# Patient Record
Sex: Female | Born: 1970 | Race: White | Hispanic: No | Marital: Married | Smoking: Never smoker
Health system: Southern US, Community
[De-identification: ages and names within clinical notes are randomized; demographics above are authoritative.]

## PROBLEM LIST (undated history)

## (undated) DIAGNOSIS — N6002 Solitary cyst of left breast: Secondary | ICD-10-CM

## (undated) HISTORY — PX: TUBAL LIGATION: SHX77

---

## 2010-12-07 ENCOUNTER — Observation Stay
Admission: EM | Admit: 2010-12-07 | Disposition: A | Discharge status: Home / Self Care | Payer: Self-pay | Source: Emergency Department | Admitting: Emergency Medicine

## 2010-12-07 LAB — CBC AND DIFFERENTIAL
BASOPHILS %: 0.1 % (ref 0.0–2.0)
Baso(Absolute): 0.01 10*3/uL (ref 0.00–0.20)
Eosinophils %: 0 % (ref 0.0–6.0)
Eosinophils Absolute: 0 10*3/uL — ABNORMAL LOW (ref 0.10–0.30)
Hematocrit: 39.8 % (ref 27.0–49.5)
Hemoglobin: 14.3 g/dL (ref 11.7–15.5)
Immature Granulocytes #: 0.01 10*3/uL (ref 0.00–0.05)
Immature Granulocytes %: 0.1 % — ABNORMAL HIGH (ref 0.0–0.0)
Lymphocytes Absolute: 0.74 10*3/uL — ABNORMAL LOW (ref 1.00–4.80)
Lymphocytes Relative: 6.1 % — ABNORMAL LOW (ref 25.0–55.0)
MCH: 30.6 pg (ref 27.0–34.0)
MCHC: 35.9 g/dL (ref 32.0–36.0)
MCV: 85.2 fL (ref 80–100)
MPV: 9 fL (ref 9.0–13.0)
Monocytes Absolute: 0.2 10*3/uL (ref 0.10–1.20)
Monocytes Relative %: 1.6 % (ref 1.0–8.0)
Neutrophils Absolute: 11.18 10*3/uL — ABNORMAL HIGH (ref 1.80–7.70)
Neutrophils Relative %: 92.2 % — ABNORMAL HIGH (ref 49.0–69.0)
Nucleated RBC %: 0 /100WBC (ref 0.0–0.0)
Nucleted RBC #: 0 10*3/uL (ref 0.00–0.00)
Platelets: 240 10*3/uL (ref 150–400)
RBC: 4.67 M/uL (ref 3.80–5.40)
RDW: 12.9 % (ref 11.0–14.0)
WBC: 12.13 10*3/uL — ABNORMAL HIGH (ref 4.80–10.80)

## 2010-12-07 LAB — COMPREHENSIVE METABOLIC PANEL
ALT: 26 U/L (ref 7–56)
AST (SGOT): 26 U/L (ref 5–40)
Albumin, Synovial: 4.5 g/dL (ref 3.9–5.0)
Alkaline Phosphatase: 77 U/L (ref 38–126)
BUN / Creatinine Ratio: 31 — ABNORMAL HIGH (ref 8–20)
BUN: 19 mg/dL (ref 6–20)
Bilirubin, Total: 0.6 mg/dL (ref 0.2–1.3)
CO2: 21 mmol/L (ref 21.0–31.0)
Calcium: 9.2 mg/dL (ref 8.4–10.2)
Chloride: 103 mmol/L (ref 101–111)
Creatinine: 0.62 mg/dL (ref 0.52–1.04)
EGFR: >60 mL/min/{1.73_m2}
EGFR: >60 mL/min/{1.73_m2}
Glucose: 137 mg/dL — ABNORMAL HIGH (ref 70–100)
Potassium: 4 mmol/L (ref 3.6–5.0)
Protein, Total: 7.9 g/dL (ref 6.3–8.2)
Sodium: 137 mmol/L (ref 135–145)

## 2010-12-07 LAB — CK TOTAL AND CKMB: CKMB Mass: 2.49 ng/mL (ref 0.00–3.38)

## 2010-12-07 LAB — TROPONIN I
Troponin I: <0.012 ng/mL (ref 0.000–0.034)
Troponin I: <0.012 ng/mL (ref 0.000–0.034)

## 2010-12-07 LAB — LI-FRAUMENI SYNDROME KNOWN FAMILIAL MUTATION: Lipase: 63 U/L (ref 23–300)

## 2010-12-07 LAB — AMYLASE: Amylase: 54 U/L (ref 29–110)

## 2010-12-07 LAB — CK
Creatine Kinase (CK): 422 U/L — ABNORMAL HIGH (ref 19–204)
Creatine Kinase (CK): 342 U/L — ABNORMAL HIGH (ref 19–204)

## 2010-12-07 LAB — HCG, SERUM, QUALITATIVE: Hcg Qualitative: NEGATIVE

## 2010-12-07 LAB — MAGNESIUM: Magnesium: 1.8 mg/dL (ref 1.7–2.2)

## 2010-12-07 LAB — MYOGLOBIN, SERUM: Myoglobin: 79.5 ng/mL — ABNORMAL HIGH (ref 0.0–62.0)

## 2010-12-13 DIAGNOSIS — R079 Chest pain, unspecified: Secondary | ICD-10-CM | POA: Insufficient documentation

## 2011-06-21 NOTE — Consults (Signed)
Maria Rowe, Maria Rowe                                                    MRN:          [id number]                                                          Account:      [id number]                                                     Document ID:  [id number]                                               Service Date: 12/07/2010                                                                                    MRN: [id number]  Document ID: [id number]  Admit Date: 12/07/2010     Patient Location: [id number]   Patient Type: O     CONSULTING PHYSICIAN: Joni Reining MD     REFERRING PHYSICIAN:         HISTORY OF PRESENT ILLNESS:  the patient is a 40 year old female with no prior cardiac history.  She  began using P90X program.  She started this on March 19.  She had no  problems during the aerobic part of exercise.  Last evening, she developed  the onset of right-sided chest heaviness with an occasional sharp  component.  This persisted throughout the night. The discomfort was  definitely made worse in the supine position or if she turned on her right  side.  It persisted and she came to the emergency department.  She has a  brother who had MI at age 43 and the patient was quite concerned.     Generally, the patient as well.  She has no PND, orthopnea, pedal edema,  palpitations, syncope or presyncope.     PAST MEDICAL HISTORY:  C-section x2.     RISK FACTORS:  Positive family history as noted above.     SOCIAL HISTORY:  Occasional alcohol, no tobacco.     REVIEW OF SYSTEMS:  See history of present illness.  Other systems including constitutional,  head, eyes, ears, nose and throat, GI, GU, musculoskeletal, skin,  respiratory, neurologic, psychiatric, endocrinologic systems negative.     PHYSICAL EXAMINATION:  VITAL SIGNS:  Blood pressure 136/71, pulse 70 and regular, respirations 16  and unlabored.  GENERAL:  Well-developed, well-nourished, alert and oriented  x3.  HEENT:  No jugular venous distention or carotid bruits.   Conjunctivae pink,  oral mucosa moist.                                                                                                           Page 1 of 2  Maria Rowe, Maria Rowe                                                    MRN:          [id number]                                                          Account:      [id number]                                                     Document ID:  [id number]                                               Service Date: 12/07/2010                                                                                    LUNGS:  Clear.  CARDIAC:  PMI normal. No significant murmurs, rubs, clicks, or gallops.  ABDOMEN:  Mild midepigastric tenderness, no rebound or guarding.  No  pulsatile masses or hepatosplenomegaly.  EXTREMITIES:  No edema.  SKIN:  No cyanosis.  PULSES:  Radial, dorsalis pedis, and posterior tibial pulses 3+ equal  bilaterally.  MUSCULOSKELETAL:  No deformities.  PSYCHIATRIC:  Normal affect.     DIAGNOSTIC STUDIES:   Amylase 54, white count 12,130, hematocrit 39.8, platelets 240,000. Sodium  137, potassium 4.0, chloride 103, bicarbonate 21, BUN 19, creatinine 0.62,  troponin negative x1.  Myoglobin slightly elevated.     IMPRESSION:  1. Atypical chest pain, probably musculoskeletal or possibly GI.  2.  Family history of CAD.     RECOMMENDATIONS:  The patient will be discharged from the emergency department today.  She  will return to the office for routine  Bruce protocol stress test in 1 to 2  weeks.     Thank you for allowing [place] to participate in the care of this pleasant lady.              D:  12/07/2010 11:27 AM by Joni Reining, MD ([id number])  T:  12/07/2010 18:00 PM by Lynnell Grain      Everlean Cherry: [id number]) (Doc ID: [id number])        cc: Aram Beecham YEW NP                                                                                                           Page 2 of 2  Authenticated by Aundria Mems, MD On 12/10/2010 12:13:29 PM

## 2012-06-14 LAB — ECG 12-LEAD
Atrial Rate: 49 {beats}/min
Atrial Rate: 55 {beats}/min
Atrial Rate: 60 {beats}/min
P Axis: -5 degrees
P Axis: 27 degrees
P-R Interval: 126 ms
P-R Interval: 132 ms
P-R Interval: 134 ms
Q-T Interval: 442 ms
Q-T Interval: 442 ms
Q-T Interval: 458 ms
QRS Duration: 76 ms
QRS Duration: 76 ms
QRS Duration: 80 ms
QTC Calculation (Bezet): 399 ms
QTC Calculation (Bezet): 438 ms
QTC Calculation (Bezet): 442 ms
R Axis: 132 degrees
R Axis: 53 degrees
R Axis: 55 degrees
T Axis: 152 degrees
T Axis: 29 degrees
T Axis: 38 degrees
Ventricular Rate: 49 {beats}/min
Ventricular Rate: 55 {beats}/min
Ventricular Rate: 60 {beats}/min

## 2016-11-15 ENCOUNTER — Encounter (INDEPENDENT_AMBULATORY_CARE_PROVIDER_SITE_OTHER): Payer: Self-pay | Admitting: Family Medicine

## 2016-11-15 ENCOUNTER — Ambulatory Visit (INDEPENDENT_AMBULATORY_CARE_PROVIDER_SITE_OTHER): Payer: Commercial Managed Care - POS | Admitting: Family Medicine

## 2016-11-15 VITALS — BP 119/80 | HR 64 | Temp 97.9°F | Resp 20 | Ht 64.0 in | Wt 220.0 lb

## 2016-11-15 DIAGNOSIS — H00011 Hordeolum externum right upper eyelid: Secondary | ICD-10-CM

## 2016-11-15 NOTE — Progress Notes (Signed)
Subjective:      Date: 11/15/2016 2:52 PM   Patient ID: Maria Rowe is a 46 y.o. female.    Chief Complaint:  Chief Complaint   Patient presents with   . Facial Swelling     right eye       HPI :  Pt Woke up this AM with swollen right eye.  She initially had trouble opening her eye but put a warm compress over the eye and was able to open it more.  Over the last hour, has felt some watery drainage.      Problem List:  There is no problem list on file for this patient.      Current Medications:  No current outpatient prescriptions on file.     No current facility-administered medications for this visit.        Allergies:  No Known Allergies    Past Medical History:  History reviewed. No pertinent past medical history.    Past Surgical History:  Past Surgical History:   Procedure Laterality Date   . CESAREAN SECTION      x3    . TUBAL LIGATION      2010       Family History:  History reviewed. No pertinent family history.    Social History:  Social History     Social History   . Marital status: Married     Spouse name: N/A   . Number of children: N/A   . Years of education: N/A     Occupational History   . Not on file.     Social History Main Topics   . Smoking status: Never Smoker   . Smokeless tobacco: Never Used   . Alcohol use Yes   . Drug use: No   . Sexual activity: Not on file     Other Topics Concern   . Not on file     Social History Narrative   . No narrative on file       The following sections were reviewed this encounter by the provider:        Vitals:  BP 119/80 (BP Site: Left arm, Patient Position: Sitting, Cuff Size: Medium)   Pulse 64   Temp 97.9 F (36.6 C) (Oral)   Resp 20   Ht 1.626 m (5\' 4" )   Wt 99.8 kg (220 lb)   SpO2 98%   BMI 37.76 kg/m      Review of Systems  General ROS: negative for - chills, fatigue, fever, hot flashes, malaise, night sweats, sleep disturbance, weight gain or weight loss  Ophthalmic ROS: negative for - blurry vision, double vision, eye pain or photophobia;  positive for - eye swelling, drainage  ENT ROS: negative for - hearing change, nasal congestion, nasal discharge, sinus pain, sore throat, tinnitus or vertigo  Respiratory ROS: negative for - cough, orthopnea, shortness of breath, sputum changes or wheezing  Cardiovascular ROS: negative for - chest pain, edema, irregular heartbeat, palpitations or shortness of breath  Dermatological ROS: negative for dry skin, pruritus and rash    Objective:     Physical Exam  General appearance - alert, well appearing, and in no distress, oriented to person, place, and time and normal appearing weight  Eyes - pupils equal and reactive, extraocular eye movements intact; RIGHT UPPER EYELID:  Small area of redness/swelling  Ears - bilateral TM's and external ear canals normal  Nose - normal and patent, no erythema, discharge or polyps and normal  nontender sinuses  Mouth - mucous membranes moist, pharynx normal without lesions  Neck - supple, no significant adenopathy, no cervical lymphadenopathy  Chest - clear to auscultation, no wheezes, rales or rhonchi, symmetric air entry  Heart - normal rate, regular rhythm, normal S1, S2, no murmurs, rubs, clicks or gallops  Skin - normal coloration and turgor, no rashes, no suspicious skin lesions noted         Assessment/Plan:       1. Hordeolum externum of right upper eyelid  -  Advised pt to use warm compresses for the next few days, area does not look infected  -  Return for any increased pain, swelling  -  Recommended to make appointment for annual PE with fasting labs        [facility] Almetta Lovely, DO

## 2016-12-11 ENCOUNTER — Encounter (INDEPENDENT_AMBULATORY_CARE_PROVIDER_SITE_OTHER): Payer: Self-pay | Admitting: Family Medicine

## 2016-12-11 ENCOUNTER — Ambulatory Visit (FREE_STANDING_LABORATORY_FACILITY): Payer: Commercial Managed Care - POS | Admitting: Family Medicine

## 2016-12-11 VITALS — BP 130/86 | HR 61 | Temp 98.0°F | Resp 16 | Ht 64.0 in | Wt 217.0 lb

## 2016-12-11 DIAGNOSIS — Z0000 Encounter for general adult medical examination without abnormal findings: Secondary | ICD-10-CM

## 2016-12-11 DIAGNOSIS — Z23 Encounter for immunization: Secondary | ICD-10-CM

## 2016-12-11 LAB — CBC AND DIFFERENTIAL
Absolute NRBC: 0 10*3/uL
Basophils Absolute Automated: 0.03 10*3/uL (ref 0.00–0.20)
Basophils Automated: 0.5 %
Eosinophils Absolute Automated: 0.17 10*3/uL (ref 0.00–0.70)
Eosinophils Automated: 2.9 %
Hematocrit: 43 % (ref 37.0–47.0)
Hgb: 14.5 g/dL (ref 12.0–16.0)
Immature Granulocytes Absolute: 0.01 10*3/uL
Immature Granulocytes: 0.2 %
Lymphocytes Absolute Automated: 1.77 10*3/uL (ref 0.50–4.40)
Lymphocytes Automated: 30.6 %
MCH: 31.3 pg (ref 28.0–32.0)
MCHC: 33.7 g/dL (ref 32.0–36.0)
MCV: 92.7 fL (ref 80.0–100.0)
MPV: 9.1 fL — ABNORMAL LOW (ref 9.4–12.3)
Monocytes Absolute Automated: 0.43 10*3/uL (ref 0.00–1.20)
Monocytes: 7.4 %
Neutrophils Absolute: 3.38 10*3/uL (ref 1.80–8.10)
Neutrophils: 58.4 %
Nucleated RBC: 0 /100 WBC (ref 0.0–1.0)
Platelets: 316 10*3/uL (ref 140–400)
RBC: 4.64 10*6/uL (ref 4.20–5.40)
RDW: 13 % (ref 12–15)
WBC: 5.79 10*3/uL (ref 3.50–10.80)

## 2016-12-11 LAB — LIPID PANEL
Cholesterol / HDL Ratio: 4.2
Cholesterol: 188 mg/dL (ref 0–199)
HDL: 45 mg/dL (ref 40–9999)
LDL Calculated: 115 mg/dL — ABNORMAL HIGH (ref 0–99)
Triglycerides: 138 mg/dL (ref 34–149)
VLDL Calculated: 28 mg/dL (ref 10–40)

## 2016-12-11 LAB — COMPREHENSIVE METABOLIC PANEL
ALT: 28 U/L (ref 0–55)
AST (SGOT): 21 U/L (ref 5–34)
Albumin/Globulin Ratio: 1.3 (ref 0.9–2.2)
Albumin: 4.1 g/dL (ref 3.5–5.0)
Alkaline Phosphatase: 60 U/L (ref 37–106)
BUN: 11 mg/dL (ref 7.0–19.0)
Bilirubin, Total: 0.8 mg/dL (ref 0.1–1.2)
CO2: 24 mEq/L (ref 21–29)
Calcium: 9.3 mg/dL (ref 8.5–10.5)
Chloride: 104 mEq/L (ref 100–111)
Creatinine: 0.8 mg/dL (ref 0.4–1.5)
Globulin: 3.2 g/dL (ref 2.0–3.7)
Glucose: 86 mg/dL (ref 70–100)
Potassium: 4.5 mEq/L (ref 3.5–5.1)
Protein, Total: 7.3 g/dL (ref 6.0–8.3)
Sodium: 139 mEq/L (ref 136–145)

## 2016-12-11 LAB — TSH: TSH: 0.79 u[IU]/mL (ref 0.35–4.94)

## 2016-12-11 LAB — HEMOLYSIS INDEX: Hemolysis Index: 8 (ref 0–18)

## 2016-12-11 LAB — GFR: EGFR: >60

## 2016-12-11 LAB — HEMOGLOBIN A1C
Average Estimated Glucose: 102.5 mg/dL
Hemoglobin A1C: 5.2 % (ref 4.6–5.9)

## 2016-12-11 NOTE — Progress Notes (Signed)
Subjective:      Date: 12/11/2016 9:50 AM   Patient ID: Maria Rowe is a 46 y.o. female.    Chief Complaint:  Chief Complaint   Patient presents with   . 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  Reported Health: fair health  Diet: moderate compliance with and well-balanced diet  Exercise: none  Dental: regular dental visits twice a year  Vision: glasses, contact lenses and regular eye exams   Hearing: normal hearing  Immunization Status: Tdap vaccination due  Reproductive Health: sexually active, married; 3 daughters ages 3, 47, and 32 - also caring for her aging parents at her home  Prior Screening Tests: pap smear within past 1-3 years and mammogram 1 year ago  General Health Risks: no family history of colon cancer and family history of breast cancer  Safety Elements Used: uses seat belts, smoke detectors in household, carbon monoxide detectors in household, sunscreen use and does not text and drive    Pt has follow up scheduled with ob-gyn to discuss irregular vaginal bleeding pattern.      Problem List:  There is no problem list on file for this patient.      Current Medications:  No current outpatient prescriptions on file.     No current facility-administered medications for this visit.        Allergies:  No Known Allergies    Past Medical History:  History reviewed. No pertinent past medical history.    Past Surgical History:  Past Surgical History:   Procedure Laterality Date   . CESAREAN SECTION      x3    . TUBAL LIGATION      2010       Family History:  Family History   Problem Relation Age of Onset   . Heart disease Mother      CHF   . Heart disease Father    . Cancer Maternal Aunt      breast   . Cancer Maternal Grandmother    . Cancer Paternal Grandmother        Social History:  Social History     Social History   . Marital status: Married     Spouse name: N/A   . Number of children: N/A   . Years of education: N/A      Occupational History   . Not on file.     Social History Main Topics   . Smoking status: Never Smoker   . Smokeless tobacco: Never Used   . Alcohol use Yes   . Drug use: No   . Sexual activity: Yes     Other Topics Concern   . Not on file     Social History Narrative   . No narrative on file       The following sections were reviewed this encounter by the provider:   Tobacco  Meds  Med Hx  Surg Hx  Fam Hx  Soc Hx        Vitals:  BP 130/86   Pulse 61   Temp 98 F (36.7 C) (Oral)   Resp 16   Ht 1.626 m (5\' 4" )   Wt 98.4 kg (217 lb)   LMP 12/11/2016 (Exact Date)   SpO2 98%   BMI 37.25 kg/m     Review of Systems  General/Constitutional:   Denies Change in appetite. Denies  Chills. Denies Fatigue. Denies Fever.   Ophthalmologic:   Denies Blurred vision. Denies Eye Discharge. Denies Eye Pain.   ENT:   Denies Nasal Discharge. Denies Hoarseness. Denies Ear pain. Denies Nosebleed. Denies Sinus pain. Denies Sore throat.   Endocrine:   Denies Decreased Libido. Denies Polydipsia. Denies Polyuria. Denies Weakness.   Respiratory:   Denies Paroxysmal Nocturnal Dyspnea. Denies Cough. Denies Orthopnea. Denies Shortness of breath. Denies Daytime Hypersomnolence. Denies Snoring. Denies Witness Apnea. Denies Wheezing.   Cardiovascular:   Denies Chest pain. Denies Chest pain with exertion. Denies Leg Claudication. Denies Palpitations. Denies Swelling in hands/feet.   Gastrointestinal:   Denies Abdominal pain. Denies Blood in stool. Denies Constipation. Denies Diarrhea. Denies Heartburn. Denies Nausea. Denies Vomiting.   Hematology:   Denies Easy bruising. Denies Easy Bleeding. Denies Swollen glands.   Genitourinary:   Denies Blood in urine. Denies Nocturia. Denies Painful urination. Denies irregular vaginal bleeding.    Musculoskeletal:   Denies Joint pain. Denies Joint stiffness. Denies Leg cramps. Denies Muscle aches. Denies Weakness in LE. Denies Swollen joints. Denies Weakness in UE.   Skin:   Denies Itching.  Denies Change in Mole(s). Denies Rash.   Neurologic:   Denies Balance difficulty. Denies Dizziness. Denies Gait abnormality. Denies Headache. Denies Pre-Syncope. Denies Memory loss. Denies Seizures. Denies Tingling/Numbness.   Psychiatric:   Denies Anxiety. Denies Depressed mood. Denies Difficulty sleeping.       Objective:     Physical Exam  General appearance - alert, well appearing, and in no distress, oriented to person, place, and time and normal appearing weight  Mental status - alert, oriented to person, place, and time, depressed mood, appropriate behavior, speech, dress, motor activity, and thought processes  Eyes - pupils equal and reactive, extraocular eye movements intact  Ears - bilateral TM's and external ear canals normal  Nose - normal and patent, no erythema, discharge or polyps and normal nontender sinuses  Mouth - mucous membranes moist, pharynx normal without lesions  Neck - supple, no significant adenopathy, no cervical lymphadenopathy  Chest - clear to auscultation, no wheezes, rales or rhonchi, symmetric air entry  Heart - normal rate, regular rhythm, normal S1, S2, no murmurs, rubs, clicks or gallops  Abdomen - soft, nontender, nondistended, no masses or organomegaly  Neurological - alert, oriented, normal speech, no focal findings or movement disorder noted  Musculoskeletal - no joint tenderness, deformity or swelling  Extremities - peripheral pulses normal, no pedal edema, no clubbing or cyanosis  Skin - normal coloration and turgor, no rashes, no suspicious skin lesions noted      Assessment/Plan:       1. Well woman exam (no gynecological exam)  - Comprehensive metabolic panel  - CBC and differential  - TSH  - Lipid panel  - Hemoglobin A1C    2. Need for vaccination  - Tdap vaccine greater than or equal to 7yo IM          [facility] Almetta Lovely, DO

## 2017-07-13 ENCOUNTER — Encounter (INDEPENDENT_AMBULATORY_CARE_PROVIDER_SITE_OTHER): Payer: Self-pay | Admitting: Internal Medicine

## 2017-07-13 ENCOUNTER — Ambulatory Visit (INDEPENDENT_AMBULATORY_CARE_PROVIDER_SITE_OTHER): Payer: Commercial Managed Care - POS | Admitting: Internal Medicine

## 2017-07-13 VITALS — BP 127/81 | HR 99 | Temp 98.1°F | Wt 217.0 lb

## 2017-07-13 DIAGNOSIS — Z23 Encounter for immunization: Secondary | ICD-10-CM

## 2017-07-13 DIAGNOSIS — M79672 Pain in left foot: Secondary | ICD-10-CM

## 2017-07-13 NOTE — Progress Notes (Signed)
Have you seen any specialists/other providers since your last visit with [place]?    Yes podiatry    Arm preference verified?   No    The patient is due for nothing at this time, HM is up-to-date.     Subjective:      Date: 07/13/2017 3:49 PM   Patient ID: Maria Rowe is a 46 y.o. female.    Chief Complaint:  Chief Complaint   Patient presents with   . Foot Pain       HPI:  Pt has been seeing a podiatrist for plantar fascitis for the left foot since Jun 25, 2017      Foot Pain   This is a new problem. Episode onset: last night. The problem has been gradually worsening. Associated symptoms include myalgias (left foot pain). Pertinent negatives include no chills, fatigue, fever or joint swelling. The symptoms are aggravated by walking. Treatments tried: boot for night time, celebrex.   symptoms started yesterday when she played at her daughter's soft ball game ( parents invited to play). Pain started acutely as she started to run.  Noticed bruise today morning left foot sole  No fall or trauma   Able to walk with shoes on   Bare foot pain more       Problem List:  There is no problem list on file for this patient.      Current Medications:  Current Outpatient Prescriptions   Medication Sig Dispense Refill   . celecoxib (CELEBREX) 200 MG capsule TK 1 C PO DAILY AFTER A MEAL  1     No current facility-administered medications for this visit.        Allergies:  No Known Allergies    Past Medical History:  History reviewed. No pertinent past medical history.    Past Surgical History:  Past Surgical History:   Procedure Laterality Date   . CESAREAN SECTION      x3    . TUBAL LIGATION      2010       Family History:  Family History   Problem Relation Age of Onset   . Heart disease Mother         CHF   . Heart disease Father    . Cancer Maternal Aunt         breast   . Cancer Maternal Grandmother    . Cancer Paternal Grandmother        Social History:  Social History     Social History   . Marital status: Married     Spouse  name: N/A   . Number of children: N/A   . Years of education: N/A     Occupational History   . Not on file.     Social History Main Topics   . Smoking status: Never Smoker   . Smokeless tobacco: Never Used   . Alcohol use Yes   . Drug use: No   . Sexual activity: Yes     Other Topics Concern   . Not on file     Social History Narrative   . No narrative on file       The following sections were reviewed this encounter by the provider:   Tobacco  Allergies  Meds  Problems  Med Hx  Surg Hx  Fam Hx  Soc Hx          Vitals:  BP 127/81   Pulse 99   Temp 98.1 F (36.7  C) (Oral)   Wt 98.4 kg (217 lb)   SpO2 96%   BMI 37.25 kg/m       ROS:  Review of Systems   Constitutional: Negative for chills, fatigue and fever.   Musculoskeletal: Positive for gait problem and myalgias (left foot pain). Negative for joint swelling.          Objective:       Physical Exam:  Physical Exam   Constitutional: She is oriented to person, place, and time. She appears well-developed and well-nourished. No distress.   Musculoskeletal:   Left foot : 1-2 area of bruise + sole   No warmth to touch   ttp +  FROM +  No mass or fluctuance   Neurological: She is alert and oriented to person, place, and time.          Assessment/Plan:       1. Acute foot pain, left  ? Soft tissue bruise  Advised RICE   Warm water soaks with epsom salt     See Podiatrist   2. Need for immunization against influenza  - Flu vaccine QUAD PRES FREE 44YRS & GREATER          Morrison Old, MD

## 2017-09-19 ENCOUNTER — Encounter (INDEPENDENT_AMBULATORY_CARE_PROVIDER_SITE_OTHER): Payer: Self-pay | Admitting: Family

## 2017-09-19 ENCOUNTER — Ambulatory Visit (INDEPENDENT_AMBULATORY_CARE_PROVIDER_SITE_OTHER): Payer: Commercial Managed Care - POS | Admitting: Family

## 2017-09-19 VITALS — BP 110/77 | HR 75 | Temp 98.5°F | Wt 218.0 lb

## 2017-09-19 DIAGNOSIS — J0100 Acute maxillary sinusitis, unspecified: Secondary | ICD-10-CM

## 2017-09-19 MED ORDER — AMOXICILLIN-POT CLAVULANATE 875-125 MG PO TABS
1.0000 | ORAL_TABLET | Freq: Two times a day (BID) | ORAL | 0 refills | Status: AC
Start: 2017-09-19 — End: 2017-09-26

## 2017-09-19 NOTE — Progress Notes (Signed)
[facility]    PROGRESS NOTE      Patient: Maria Rowe   Date: 09/19/2017   MRN: [id number]     History reviewed. No pertinent past medical history.  Social History     Social History   . Marital status: Married     Spouse name: N/A   . Number of children: N/A   . Years of education: N/A     Occupational History   . Not on file.     Social History Main Topics   . Smoking status: Never Smoker   . Smokeless tobacco: Never Used   . Alcohol use Yes   . Drug use: No   . Sexual activity: Yes     Other Topics Concern   . Not on file     Social History Narrative   . No narrative on file     Family History   Problem Relation Age of Onset   . Heart disease Mother         CHF   . Heart disease Father    . Cancer Maternal Aunt         breast   . Cancer Maternal Grandmother    . Cancer Paternal Grandmother        ASSESSMENT/PLAN     Maria Rowe is a 47 y.o. female    Chief Complaint   Patient presents with   . URI     Patient presents with c/o nasal congestion, sore throat, productive cough with ocasional yellow phelgm, chills, body aches. No fever, N/V/D. Txs tried include Mucinex- DM and Mucinex Sinus.         1. Acute non-recurrent maxillary sinusitis  - amoxicillin-clavulanate (AUGMENTIN) 875-125 MG per tablet; Take 1 tablet by mouth 2 (two) times daily.for 7 days  Dispense: 14 tablet; Refill: 0    Continue with Mucinex DM  Increase fluid intake to 2-3 liters per day, steam inhalation 20-30 minutes, saline irrigation with nasal saline spray, warm compresses over the sinuses, sleep with head of bed elevated, avoid exposure to cigarette smoke or fumes, and avoid caffeine and alcohol. Tylenol and Ibuprofen can be used for fever and pain.Take antibiotic as prescribed. We reviewed side effects and use of this medication. Take with meals and probiotics to help avoid GI upset. If your symptoms worsen 3 days after starting antibiotics return to your PCP to be re-evaluated.  All questions answered, pt agrees  with plan.      Ddx: viral URI, pharyngitis, bronchitis, sinusitis, pneumonia, allergic rhinitis, RAD, PND, influenza.       Risk & Benefits of the new medication(s) were explained to the patient who verbalized understanding & agreed to the treatment plan. Patient was encouraged to contact me/clinical staff with any questions/concerns    Side effects of medication were discussed with the patient including nausea, diarrhea, dizziness, abdominal pain, or rash.    MEDICATIONS     Current Outpatient Prescriptions   Medication Sig Dispense Refill   . amoxicillin-clavulanate (AUGMENTIN) 875-125 MG per tablet Take 1 tablet by mouth 2 (two) times daily.for 7 days 14 tablet 0     No current facility-administered medications for this visit.        No Known Allergies    SUBJECTIVE     Chief Complaint   Patient presents with   . URI     Patient presents with c/o nasal congestion, sore throat, productive cough with ocasional yellow phelgm, chills, body aches.  No fever, N/V/D. Txs tried include Mucinex- DM and Mucinex Sinus.         URI    This is a new problem. Episode onset: 10 days ago. There has been no fever. Associated symptoms include congestion, coughing, ear pain (pressure), headaches, sinus pain and a sore throat. Pertinent negatives include no abdominal pain, diarrhea, nausea, swollen glands or vomiting. Treatments tried: Mucinex. The treatment provided mild relief.       ROS     Review of Systems   Constitutional: Positive for appetite change (decreased) and chills. Negative for diaphoresis, fatigue and fever.   HENT: Positive for congestion, ear pain (pressure), postnasal drip, sinus pain, sinus pressure and sore throat. Negative for trouble swallowing.    Respiratory: Positive for cough. Negative for chest tightness and shortness of breath.    Gastrointestinal: Negative for abdominal pain, diarrhea, nausea and vomiting.   Musculoskeletal: Positive for myalgias.   Neurological: Positive for headaches.    Hematological: Negative for adenopathy.       The following sections were reviewed this encounter by the provider:   Tobacco  Allergies  Meds  Problems  Med Hx  Surg Hx  Fam Hx  Soc Hx          PHYSICAL EXAM     Vitals:    09/19/17 1554   BP: 110/77   BP Site: Left arm   Patient Position: Sitting   Cuff Size: Large   Pulse: 75   Temp: 98.5 F (36.9 C)   TempSrc: Oral   SpO2: 97%   Weight: 98.9 kg (218 lb)       Physical Exam   Constitutional: She is oriented to person, place, and time. She appears well-developed and well-nourished. No distress.   HENT:   Head: Normocephalic and atraumatic.   Right Ear: External ear and ear canal normal. Tympanic membrane is not erythematous and not bulging. A middle ear effusion is present.   Left Ear: External ear and ear canal normal. Tympanic membrane is not erythematous and not bulging. A middle ear effusion is present.   Nose: Mucosal edema present. Right sinus exhibits maxillary sinus tenderness. Right sinus exhibits no frontal sinus tenderness. Left sinus exhibits maxillary sinus tenderness. Left sinus exhibits no frontal sinus tenderness.   Mouth/Throat: Uvula is midline, oropharynx is clear and moist and mucous membranes are normal.   +mucopurulent post nasal drip   Eyes: Pupils are equal, round, and reactive to light. Conjunctivae are normal. Right eye exhibits no discharge. Left eye exhibits no discharge.   Neck: Normal range of motion. Neck supple.   Cardiovascular: Normal rate, regular rhythm and normal heart sounds.  Exam reveals no gallop and no friction rub.    No murmur heard.  Pulmonary/Chest: Effort normal and breath sounds normal. No respiratory distress. She has no decreased breath sounds. She has no wheezes. She has no rhonchi. She has no rales.   Lymphadenopathy:     She has no cervical adenopathy.   Neurological: She is alert and oriented to person, place, and time.   Skin: Skin is warm and dry. She is not diaphoretic.   Psychiatric: She has a  normal mood and affect. Her behavior is normal. Judgment and thought content normal.   Nursing note and vitals reviewed.    Ortho Exam  Neurologic Exam     Mental Status   Oriented to person, place, and time.     Cranial Nerves     CN III, IV, VI  Pupils are equal, round, and reactive to light.      PROCEDURE(S)     Procedures        Signed,  Morene Crocker, FNP  09/19/2017

## 2017-09-19 NOTE — Patient Instructions (Addendum)
Acute Sinusitis    Acute sinusitis isirritation and swelling of the sinuses. It is usuallycaused by a viral infection after a common cold. Your doctor can help you find relief.  What is acute sinusitis?  Sinuses are air-filled spaces in the skull behind the face. They are kept moist and clean by a lining of mucosa. Things such as pollen, smoke, and chemical fumes can irritate the mucosa. It can then swell up. As a response to irritation, the mucosa makes more mucus and other fluids. Tiny hairlike cilia cover the mucosa. Cilia help carry mucus toward the opening of the sinus. Too much mucus may cause the cilia to stop working. This blocks the sinus opening. A buildup of fluid in the sinuses then causes pain and pressure. It can also encourage bacteria to grow in the sinuses.  Common symptoms of acute sinusitis  You may have:   Facial sorenesspain   Headache   Fever   Fluid draining in the back of the throat (postnasal drip)   Congestion   Drainage that is thick and colored, instead of clear   Cough  Diagnosing acute sinusitis  Yourdoctor will ask about your symptoms and health history.He or she will look at your ear, nose, and throat. You usually won't need to have X-rays taken.  The doctor may take asample of mucus to check for bacteria. If you havesinusitis that keeps coming back, you may need imaging tests such asX-rays or CAT scans. This will help your doctorcheck for a structural problem that may be causingthe infection.  Treating acute sinusitis  Treatment is aimed atunblocking the sinus opening and helping the cilia work again. You may need to take antihistamine and decongestant medicine. These can reduce inflammation and decrease the amount of fluid your sinuses make. If you have a bacterial infection, you will need to takeantibiotic medicine for 10 to 14 days. Take this medicine until it is gone, even if you feel better.  Date Last Reviewed: 06/19/2015   2000-2018 [employer] [address], [place] [address]. All rights reserved. This information is not intended as a substitute for professional medical care. Always follow your healthcare professional's instructions.        Amoxicillin; Clavulanic Acid tablets  Brand Name: Augmentin  What is this medicine?  AMOXICILLIN; CLAVULANIC ACID (a mox i SIL in; KLAV yoo lan ic AS id) is a penicillin antibiotic. It is used to treat certain kinds of bacterial infections. It will not work for colds, flu, or other viral infections.  How should I use this medicine?  Take this medicine by mouth with a full glass of water. Follow the directions on the prescription label. Take at the start of a meal. Do not crush or chew. If the tablet has a score line, you may cut it in half at the score line for easier swallowing. Take your medicine at regular intervals. Do not take your medicine more often than directed. Take all of your medicine as directed even if you think you are better. Do not skip doses or stop your medicine early.  Talk to your pediatrician regarding the use of this medicine in children. Special care may be needed.  What side effects may I notice from receiving this medicine?  Side effects that you should report to your doctor or health care professional as soon as possible:   allergic reactions like skin rash, itching or hives, swelling of the face, lips, or tongue   breathing problems     dark urine   fever or chills, sore throat   redness, blistering, peeling or loosening of the skin, including inside the mouth   seizures   trouble passing urine or change in the amount of urine   unusual bleeding, bruising   unusually weak or tired   white patches or sores in the mouth or throat  Side effects that usually do not require medical attention (report to your doctor or health care professional if they continue or are bothersome):   diarrhea   dizziness   headache   nausea, vomiting   stomach upset   vaginal or anal  irritation  What may interact with this medicine?   allopurinol   anticoagulants   birth control pills   methotrexate   probenecid  What if I miss a dose?  If you miss a dose, take it as soon as you can. If it is almost time for your next dose, take only that dose. Do not take double or extra doses.  Where should I keep my medicine?  Keep out of the reach of children.  Store at room temperature below 25 degrees C (77 degrees F). Keep container tightly closed. Throw away any unused medicine after the expiration date.  What should I tell my health care provider before I take this medicine?  They need to know if you have any of these conditions:   bowel disease, like colitis   kidney disease   liver disease   mononucleosis   an unusual or allergic reaction to amoxicillin, penicillin, cephalosporin, other antibiotics, clavulanic acid, other medicines, foods, dyes, or preservatives   pregnant or trying to get pregnant   breast-feeding  What should I watch for while using this medicine?  Tell your doctor or health care professional if your symptoms do not improve.  Do not treat diarrhea with over the counter products. Contact your doctor if you have diarrhea that lasts more than 2 days or if it is severe and watery.  If you have diabetes, you may get a false-positive result for sugar in your urine. Check with your doctor or health care professional.  Birth control pills may not work properly while you are taking this medicine. Talk to your doctor about using an extra method of birth control.  NOTE:This sheet is a summary. It may not cover all possible information. If you have questions about this medicine, talk to your doctor, pharmacist, or health care provider. Copyright 2018 [employer]

## 2018-07-31 ENCOUNTER — Encounter (INDEPENDENT_AMBULATORY_CARE_PROVIDER_SITE_OTHER): Payer: Self-pay | Admitting: Family Medicine

## 2018-08-02 ENCOUNTER — Encounter (INDEPENDENT_AMBULATORY_CARE_PROVIDER_SITE_OTHER): Payer: Self-pay | Admitting: Family

## 2018-08-02 ENCOUNTER — Ambulatory Visit (INDEPENDENT_AMBULATORY_CARE_PROVIDER_SITE_OTHER): Payer: Commercial Managed Care - POS | Admitting: Family

## 2018-08-02 VITALS — BP 121/76 | HR 73 | Temp 98.3°F | Wt 221.0 lb

## 2018-08-02 DIAGNOSIS — N611 Abscess of the breast and nipple: Secondary | ICD-10-CM

## 2018-08-02 DIAGNOSIS — Z23 Encounter for immunization: Secondary | ICD-10-CM

## 2018-08-02 MED ORDER — AMOXICILLIN-POT CLAVULANATE 875-125 MG PO TABS
1.0000 | ORAL_TABLET | Freq: Two times a day (BID) | ORAL | 0 refills | Status: AC
Start: 2018-08-02 — End: 2018-08-09

## 2018-08-02 NOTE — Progress Notes (Signed)
[facility]    PROGRESS NOTE      Patient: Maria Rowe   Date: 08/02/2018   MRN: [id number]     History reviewed. No pertinent past medical history.  Social History     Socioeconomic History   . Marital status: Married     Spouse name: Not on file   . Number of children: Not on file   . Years of education: Not on file   . Highest education level: Not on file   Occupational History   . Not on file   Social Needs   . Financial resource strain: Not on file   . Food insecurity:     Worry: Not on file     Inability: Not on file   . Transportation needs:     Medical: Not on file     Non-medical: Not on file   Tobacco Use   . Smoking status: Never Smoker   . Smokeless tobacco: Never Used   Substance and Sexual Activity   . Alcohol use: Yes   . Drug use: No   . Sexual activity: Yes   Lifestyle   . Physical activity:     Days per week: Not on file     Minutes per session: Not on file   . Stress: Not on file   Relationships   . Social connections:     Talks on phone: Not on file     Gets together: Not on file     Attends religious service: Not on file     Active member of club or organization: Not on file     Attends meetings of clubs or organizations: Not on file     Relationship status: Not on file   . Intimate partner violence:     Fear of current or ex partner: Not on file     Emotionally abused: Not on file     Physically abused: Not on file     Forced sexual activity: Not on file   Other Topics Concern   . Not on file   Social History Narrative   . Not on file     Family History   Problem Relation Age of Onset   . Heart disease Mother         CHF   . Heart disease Father    . Cancer Maternal Aunt         breast   . Cancer Maternal Grandmother    . Cancer Paternal Grandmother        ASSESSMENT/PLAN     Maria Rowe is a 47 y.o. female    Chief Complaint   Patient presents with   . Breast Pain        1. Left breast abscess  - amoxicillin-clavulanate (AUGMENTIN) 875-125 MG per tablet; Take 1 tablet  by mouth 2 (two) times daily for 7 days  Dispense: 14 tablet; Refill: 0    - continue with Tylenol/Ibuprofen for pain control  - warm compresses  - if increasing rapidly in size, severe pain, or fevers develop recommend evaluation in ER.  - keep follow up with breast specialist    2. Need for influenza vaccination  - Flu vacc QUAD PF 6 MOS & UP (Afluria/ Fluarix, Flulaval, Fluzone)  - reviewed s/sx of reaction  - VIS provided       No results found for this or any previous visit (from the past 24 hour(s)).  Risk & Benefits of the new medication(s) were explained to the patient (and family) who verbalized understanding & agreed to the treatment plan. Patient (family) encouraged to contact me/clinical staff with any questions/concerns      MEDICATIONS     No outpatient medications have been marked as taking for the 08/02/18 encounter (Office Visit) with Morene Crocker, FNP.         No Known Allergies    SUBJECTIVE     Chief Complaint   Patient presents with   . Breast Pain        HPI  Patient here for pain in her left breast that started one week ago and has progressively worsened. She reports history of a small cyst that has been benign on previous mammograms that has been monitored by OBGyn. The cyst has become larger, red, and tender and she was seen by OBGyn who ordered a mammogram and ultrasound which she had done on Tuesday (3 days ago). The radiologist told her the area was infected and that she needed antibiotics and to follow up with a breast specialist. She has an appointment with the breast specialist 6 days from now. When she called OBGyn to discuss recommendation for antibiotics she was advised to see primary care. She has been using Tylenol and Ibuprofen to help with the pain and warm compresses. These help with the pain but have not had an impact on the size of cyst.     She would like a flu shot while she is here today.   ROS     Review of Systems   Constitutional: Negative for appetite change,  chills, diaphoresis, fatigue and fever.   HENT: Negative.    Respiratory: Negative for cough and shortness of breath.    Musculoskeletal: Negative for myalgias.   Skin: Positive for color change (redness and swelling to left breast).   Neurological: Negative for headaches.   Hematological: Negative for adenopathy.       The following sections were reviewed this encounter by the provider:   Tobacco  Allergies  Meds  Problems  Med Hx  Surg Hx  Fam Hx         PHYSICAL EXAM     Vitals:    08/02/18 1231   BP: 121/76   BP Site: Right arm   Patient Position: Sitting   Cuff Size: Large   Pulse: 73   Temp: 98.3 F (36.8 C)   TempSrc: Oral   SpO2: 97%   Weight: 100.2 kg (221 lb)       Physical Exam  Vitals signs and nursing note reviewed.   Constitutional:       General: She is not in acute distress.     Appearance: She is well-developed. She is not diaphoretic.   HENT:      Head: Normocephalic and atraumatic.   Eyes:      Pupils: Pupils are equal, round, and reactive to light.   Cardiovascular:      Rate and Rhythm: Normal rate and regular rhythm.      Heart sounds: Normal heart sounds. No murmur. No friction rub. No gallop.    Pulmonary:      Effort: Pulmonary effort is normal. No respiratory distress.      Breath sounds: Normal breath sounds. No decreased breath sounds, wheezing, rhonchi or rales.   Chest:      Breasts:         Left: Swelling, mass, skin change and tenderness present. No nipple  discharge.       Lymphadenopathy:      Upper Body:      Right upper body: No axillary or pectoral adenopathy.      Left upper body: No axillary or pectoral adenopathy.   Skin:     General: Skin is warm and dry.   Neurological:      Mental Status: She is alert and oriented to person, place, and time.   Psychiatric:         Behavior: Behavior normal.         Thought Content: Thought content normal.         Judgment: Judgment normal.       Ortho Exam  Neurologic Exam     Mental Status   Oriented to person, place, and time.      Cranial Nerves     CN III, IV, VI   Pupils are equal, round, and reactive to light.      PROCEDURE(S)     Procedures        Signed,  Morene Crocker, FNP  08/02/2018

## 2018-08-02 NOTE — Patient Instructions (Signed)
Abscess (Antibiotic Treatment Only)  An abscess (sometimes called a "boil") happens when bacteria get trapped under the skin and start to grow. Pus forms inside the abscess as the body responds to the bacteria. An abscess can happen with an insect bite, ingrown hair, blocked oil gland, pimple, cyst, or puncture wound.  In the early stages, your wound may be red and tender. For this stage, you may get antibiotics. If the abscess does not get better with antibiotics, it will need to be drained with a small cut.  Home care  These tips will help you care for your abscess at home:   Soak the wound in hot water or apply hot packs (small towel soaked in hot water) to the area for 20 minutes at a time. Do this 3 to 4 times a day.   Do not cut, squeeze, or pop the boil yourself.   Apply antibiotic cream or ointment to the skin 3 to 4 times a day, unless something else was prescribed. Some ointments include an antibiotic plus a pain reliever.   If your doctor prescribed antibiotics, do not stop taking them until you have finished the medicine or the doctor tells you to stop.   You may use an over-the-counter pain medicine to control pain, unless another pain medicine was prescribed. If you have chronic liver or kidney disease or ever had a stomach ulcer orgastrointestinal bleeding, talk with your doctor before using these any of these.  Follow-up care  Follow up with your healthcare provider, or as advised. Check your wound each day for the signs of worsening infection listed below.  When to seek medical advice  Get prompt medical attention if any of these occur:   An increase in redness or swelling   Red streaks in the skin leading away from the abscess   An increase in local pain or swelling   Fever of 100.4F (38C) or higher, or as directed by your healthcare provider   Pus or fluid coming from the abscess   Boil returns after getting better  [employer] last reviewed this educational content on 05/20/2015    2000-2019 [employer] [address], [place] [address]. All rights reserved. This information is not intended as a substitute for professional medical care. Always follow your healthcare professional's instructions.

## 2018-08-06 ENCOUNTER — Encounter (INDEPENDENT_AMBULATORY_CARE_PROVIDER_SITE_OTHER): Payer: Self-pay

## 2018-08-06 ENCOUNTER — Ambulatory Visit (INDEPENDENT_AMBULATORY_CARE_PROVIDER_SITE_OTHER): Payer: Commercial Managed Care - POS | Admitting: Family

## 2018-08-06 ENCOUNTER — Telehealth (INDEPENDENT_AMBULATORY_CARE_PROVIDER_SITE_OTHER): Payer: Self-pay | Admitting: Family Medicine

## 2018-08-06 VITALS — BP 120/86 | HR 75 | Temp 98.4°F | Resp 18 | Ht 64.0 in | Wt 222.0 lb

## 2018-08-06 DIAGNOSIS — N611 Abscess of the breast and nipple: Secondary | ICD-10-CM

## 2018-08-06 NOTE — Progress Notes (Signed)
[facility]    PROGRESS NOTE      Patient: Maria Rowe   Date: 08/06/2018   MRN: [id number]     History reviewed. No pertinent past medical history.  Social History     Socioeconomic History   . Marital status: Married     Spouse name: Not on file   . Number of children: Not on file   . Years of education: Not on file   . Highest education level: Not on file   Occupational History   . Not on file   Social Needs   . Financial resource strain: Not on file   . Food insecurity:     Worry: Not on file     Inability: Not on file   . Transportation needs:     Medical: Not on file     Non-medical: Not on file   Tobacco Use   . Smoking status: Never Smoker   . Smokeless tobacco: Never Used   Substance and Sexual Activity   . Alcohol use: Yes   . Drug use: No   . Sexual activity: Yes   Lifestyle   . Physical activity:     Days per week: Not on file     Minutes per session: Not on file   . Stress: Not on file   Relationships   . Social connections:     Talks on phone: Not on file     Gets together: Not on file     Attends religious service: Not on file     Active member of club or organization: Not on file     Attends meetings of clubs or organizations: Not on file     Relationship status: Not on file   . Intimate partner violence:     Fear of current or ex partner: Not on file     Emotionally abused: Not on file     Physically abused: Not on file     Forced sexual activity: Not on file   Other Topics Concern   . Not on file   Social History Narrative   . Not on file     Family History   Problem Relation Age of Onset   . Heart disease Mother         CHF   . Heart disease Father    . Cancer Maternal Aunt         breast   . Cancer Maternal Grandmother    . Cancer Paternal Grandmother        ASSESSMENT/PLAN     Maria Rowe is a 47 y.o. female    Chief Complaint   Patient presents with   . Cyst        1. Abscess of left breast    Improving, continue on Augmentin.  Continue with warm compresses.   May place  abdominal pad in bra to help absorb oozing/drainage during the day.  Continue to clean area twice per day with water and antibacterial soap.   Keep follow up with breast specialist for Thursday.        No results found for this or any previous visit (from the past 24 hour(s)).      Risk & Benefits of the new medication(s) were explained to the patient (and family) who verbalized understanding & agreed to the treatment plan. Patient (family) encouraged to contact me/clinical staff with any questions/concerns      MEDICATIONS  Outpatient Medications Marked as Taking for the 08/06/18 encounter (Office Visit) with Morene Crocker, FNP   Medication Sig Dispense Refill   . amoxicillin-clavulanate (AUGMENTIN) 875-125 MG per tablet Take 1 tablet by mouth 2 (two) times daily for 7 days 14 tablet 0         No Known Allergies    SUBJECTIVE     Chief Complaint   Patient presents with   . Cyst        Pt was seen here on Friday for and abscess on her left breast. Pt said the cyst started several years ago but 3 weeks ago it got bigger and started to hurt. Pt saw her GYN last Monday who sent her to ultrasound. Infected cyst. Told to see breast specialist. Pt started antibiotics (Augmentin) on Friday. Pt said it is significantly better. Pt is doing warm compresses. Pt said yesterday the cyst ruptured with some blood and a large amount of pus that was yellow and creamy colored. She reports some pain and burning at site where cyst ruptured. She called an on-call doctor about her breast last night and was advised to follow up today to have it re-checked.    ROS     Review of Systems   Constitutional: Negative for appetite change, chills, diaphoresis, fatigue and fever.   Respiratory: Negative for shortness of breath.    Skin: Positive for color change.        Abscess left breast   Hematological: Negative for adenopathy.       The following sections were reviewed this encounter by the provider:        PHYSICAL EXAM     Vitals:     08/06/18 0917   BP: 120/86   Pulse: 75   Resp: 18   Temp: 98.4 F (36.9 C)   TempSrc: Oral   SpO2: 96%   Weight: 100.7 kg (222 lb)   Height: 1.626 m (5\' 4" )       Physical Exam  Vitals signs and nursing note reviewed.   Constitutional:       General: She is not in acute distress.     Appearance: She is well-developed. She is not diaphoretic.   HENT:      Head: Normocephalic and atraumatic.   Eyes:      Pupils: Pupils are equal, round, and reactive to light.   Cardiovascular:      Rate and Rhythm: Normal rate.   Pulmonary:      Effort: Pulmonary effort is normal. No respiratory distress.   Chest:      Breasts:         Left: Mass present.       Neurological:      Mental Status: She is alert and oriented to person, place, and time.   Psychiatric:         Behavior: Behavior normal.         Thought Content: Thought content normal.         Judgment: Judgment normal.       Ortho Exam  Neurologic Exam     Mental Status   Oriented to person, place, and time.     Cranial Nerves     CN III, IV, VI   Pupils are equal, round, and reactive to light.      PROCEDURE(S)     Procedures        Signed,  Morene Crocker, FNP  08/06/2018

## 2018-08-06 NOTE — Telephone Encounter (Signed)
On Call Late Entry:    I spoke with the patient last night about 10 pm.   The sebaceous cyst on her breast had ruptured and was getting drainage.   She denied fever or increasing or increasing pain.   She denied malaise.   She is already on antibiotics.   I recommended if she had increasing symptoms to go to the ER tonight, otherwise use warm compress, continue the antibiotic, and call her usual PCP in the morning.    She has an appointment with a breast surgeon for Thursday, but may need to be seen today for additional I and D.

## 2018-08-06 NOTE — Telephone Encounter (Signed)
Noted, thank you

## 2018-10-27 ENCOUNTER — Ambulatory Visit (INDEPENDENT_AMBULATORY_CARE_PROVIDER_SITE_OTHER): Payer: Commercial Managed Care - POS | Admitting: Physician Assistant

## 2018-10-27 ENCOUNTER — Encounter (INDEPENDENT_AMBULATORY_CARE_PROVIDER_SITE_OTHER): Payer: Self-pay | Admitting: Physician Assistant

## 2018-10-27 VITALS — BP 124/85 | HR 96 | Temp 100.8°F | Resp 16 | Ht 64.0 in | Wt 230.0 lb

## 2018-10-27 DIAGNOSIS — R6889 Other general symptoms and signs: Secondary | ICD-10-CM

## 2018-10-27 DIAGNOSIS — R070 Pain in throat: Secondary | ICD-10-CM

## 2018-10-27 DIAGNOSIS — J101 Influenza due to other identified influenza virus with other respiratory manifestations: Secondary | ICD-10-CM

## 2018-10-27 LAB — POCT RAPID STREP A: Rapid Strep A Screen POCT: NEGATIVE

## 2018-10-27 LAB — POCT INFLUENZA A/B
POCT Rapid Influenza A AG: POSITIVE — AB
POCT Rapid Influenza B AG: NEGATIVE

## 2018-10-27 MED ORDER — OSELTAMIVIR PHOSPHATE 75 MG PO CAPS
75.0000 mg | ORAL_CAPSULE | Freq: Two times a day (BID) | ORAL | 0 refills | Status: AC
Start: 2018-10-27 — End: 2018-11-01

## 2018-10-27 MED ORDER — ONDANSETRON 4 MG PO TBDP
4.0000 mg | ORAL_TABLET | Freq: Four times a day (QID) | ORAL | 0 refills | Status: AC | PRN
Start: 2018-10-27 — End: 2018-11-03

## 2018-10-27 NOTE — Progress Notes (Signed)
[facility]  PROGRESS NOTE     Patient: Maria Rowe   Date: 10/27/2018   MRN: [id number]       Maria Rowe is a 48 y.o. female complains of 3 days of nasal congestion, fevers, body aches, malaise.  Mild sore throat.  Endorses recent air travel to [place].  Endorses some nausea without vomiting.  No diarrhea.  Patient did get a flu shot this year.  No chronic immunosuppression.        HISTORY     Chief Complaint   Patient presents with    Chills     Congestion, chills, then nausea, body ache, and sore throat, but no fever.        HPI    Review of Systems   Constitutional: Positive for activity change, appetite change, chills, fatigue and fever. Negative for diaphoresis.   HENT: Negative.    Respiratory: Negative.    Cardiovascular: Negative.    Gastrointestinal: Positive for nausea. Negative for abdominal distention, abdominal pain, anal bleeding, blood in stool, constipation, diarrhea, rectal pain and vomiting.   Genitourinary: Negative.    Musculoskeletal: Positive for myalgias. Negative for arthralgias, back pain, gait problem, joint swelling, neck pain and neck stiffness.   Neurological: Negative.    All other systems reviewed and are negative.      History:  History reviewed. No pertinent past medical history.    Past Surgical History:   Procedure Laterality Date    CESAREAN SECTION      x3     TUBAL LIGATION      2010       Family History   Problem Relation Age of Onset    Heart disease Mother         CHF    Heart disease Father     Cancer Maternal Aunt         breast    Cancer Maternal Grandmother     Cancer Paternal Grandmother        Social History     Socioeconomic History    Marital status: Married     Spouse name: Not on file    Number of children: Not on file    Years of education: Not on file    Highest education level: Not on file   Occupational History    Not on file   Social Needs    Financial resource strain: Not on file    Food insecurity:     Worry: Not on  file     Inability: Not on file    Transportation needs:     Medical: Not on file     Non-medical: Not on file   Tobacco Use    Smoking status: Never Smoker    Smokeless tobacco: Never Used   Substance and Sexual Activity    Alcohol use: Yes    Drug use: No    Sexual activity: Yes   Lifestyle    Physical activity:     Days per week: Not on file     Minutes per session: Not on file    Stress: Not on file   Relationships    Social connections:     Talks on phone: Not on file     Gets together: Not on file     Attends religious service: Not on file     Active member of club or organization: Not on file     Attends meetings of clubs or  organizations: Not on file     Relationship status: Not on file    Intimate partner violence:     Fear of current or ex partner: Not on file     Emotionally abused: Not on file     Physically abused: Not on file     Forced sexual activity: Not on file   Other Topics Concern    Not on file   Social History Narrative    Not on file       History reviewed.        Current Outpatient Medications:     sulfamethoxazole-trimethoprim (BACTRIM DS,SEPTRA DS) 800-160 MG per tablet, TK 1 T PO BID FOR 5 DAYS, Disp: , Rfl:     No Known Allergies      PHYSICAL EXAM     Vitals:    10/27/18 1337   BP: 124/85   Pulse: 96   Resp: 16   Temp: (!) 100.8 F (38.2 C)   SpO2: 98%   Weight: 104.3 kg (230 lb)   Height: 1.626 m (5\' 4" )       Physical Exam    .General appearance - Well developed and well nourished. Normal gait.  HEENT- Normocephalic. Eomi. Sclera anicteric. Normal hearing. Nares normal.  Normal posterior oropharynx without erythema exudates or tonsillar enlargement.  Minimal clear rhinorrhea with postnasal drip.  Neck - Supple.  No thyromegaly.  No cervical lymphadenopathy.  Chest - clear to percussion and auscultation.  No wheeze, rhonchi, or rales.  Heart - regular rate and rhythm without murmurs, gallops, or rubs.  Abdomen - normal bowel sounds.  No focal tenderness to palpation. No  hepatosplenomegaly.  No masses. No ascites.  Musculoskeletal - normal range of motion of arms and legs.  Extremities - no clubbing, cyanosis, or edema.  Skin - no rashes or lesions.  Neurologic - Alert and oriented to person, place and time.  No focal motor or sensory deficits.        MEDICAL DECISION MAKING     DDX:    Viral syndrome, influenza, streptococcal pharyngitis.    [facility] COURSE       Results     Procedure Component Value Units Date/Time    Influenza A/B [[id number]]  (Abnormal) Collected:  10/27/18 1337     Updated:  10/27/18 1355     POCT QC Pass     POCT Rapid Influenza A AG Positive     POCT Rapid Influenza B AG Negative    Rapid Group A Strep [[id number]]  (Normal) Collected:  10/27/18 1340    Specimen:  Throat Updated:  10/27/18 1351     POCT QC Pass     Rapid Strep A Screen POCT Negative      Comment Negative Results should be confirmed by throat Cx to confirm absence of Strep A inf.                No results found.      No orders of the defined types were placed in this encounter.        PROCEDURES     Procedures       ASSESSMENT     Encounter Diagnoses   Name Primary?    Flu-like symptoms Yes    Pain in throat           ASSESSMENT    PLAN      Patient with 3 days of febrile illness.   Flu positive.   Patient nonseptic in appearance.  Patient prefers Tamiflu be administered.   Discharge home with supportive care, antipyretics and follow-up with PCP.      Orders Placed This Encounter   Procedures    Influenza A/B    Rapid Group A Strep         An After Visit Summary was printed and given to the patient.      Signed,  Jamse Arn, PA

## 2018-10-27 NOTE — Patient Instructions (Signed)
The Flu (Influenza)  The flu (influenza) is an infection that affects your respiratory tract. This tract is made up of your mouth, nose, and lungs, and the passages between them. Unlike a cold, the flu can make you very ill. And it can lead to pneumonia, a serious lung infection. The flu can have serious complications and even cause death.      Viruses that cause influenza spread through the air in droplets when someone who has the flu coughs, sneezes, laughs, or talks.   Who is at risk for the flu?  Anyone can get the flu. But you are more likely to become infected if you:   · Have a weak immune system  · Work in a healthcare setting where you may be exposed to flu germs  · Live or work with someone who has the flu  · Haven’t had the flu vaccine each year  How does the flu spread?  The flu is caused by a virus. The virus spreads through the air in droplets when someone who has the flu coughs, sneezes, laughs, or talks. You can become infected when you inhale these viruses directly. You can also become infected when you touch a surface on which the droplets have landed and then transfer the germs to your eyes, nose, or mouth. Touching used tissues, or sharing utensils, drinking glasses, or a toothbrush from an infected person can expose you to flu viruses, too.   What are the symptoms of the flu?  Flu symptoms tend to come on quickly and may last a few days to a few weeks. They include:   · Fever usually higher than 100.4° F  ( 38°C ) and chills  · Sore throat and headache  · Dry cough  · Runny nose  · Tiredness and weakness  · Muscle aches  Who is at risk for flu complications?  For some people, the flu can be very serious. The risk for complications is greater for:   · Children younger than age 5  · Adults ages 65 and older  · People with a chronic illness such as diabetes or heart, kidney, or lung disease  · People who live in a nursing home or long-term care facility  How is the flu treated?  The flu usually  gets better after 7 days or so. In some cases, your healthcare provider may prescribe an antiviral medicine. This may help you get well a little sooner. For the medicine to help, you need to take it as soon as possible (ideally within 48 hours) after your symptoms start.   If you develop pneumonia or other serious illness, you may need to stay in the hospital.   Easing flu symptoms  · Drink lots of fluids such as water, juice, and warm soup. A good rule is to drink enough so that you urinate your normal amount.  · Get plenty of rest.  · Ask your healthcare provider what to take for fever and pain.  · Call your provider if your fever is 100.4° F ( 38°C ) or higher, or you become dizzy, lightheaded, or short of breath.    Taking steps to protect others  · Wash your hands often, especially after coughing or sneezing. Or clean your hands with an alcohol-based hand cleaner containing at least 60% alcohol.  · Cough or sneeze into a tissue. Then throw the tissue away and wash your hands. If you don’t have a tissue, cough and sneeze into your elbow.  · Stay home until at least 24 hours after you no longer have a fever or chills. Be sure   the fever isn’t being hidden by fever-reducing medicine.  · Don’t share food, utensils, drinking glasses, or a toothbrush with others.  · Ask your healthcare provider if others in your household should get antiviral medicine to help them prevent infection.    How can the flu be prevented?  · One of the best ways to prevent the flu is to get a flu vaccine each year. The CDC and American Academy of Pediatrics recommend that all people 6 months of age and older get a flu vaccine every year. This includes pregnant women. Healthcare providers advise getting the flu vaccine each year as soon as it's available in your area.  · Flu virus strains change from year to year, so the vaccine changes yearly to help prevent flu viruses predicted to cause illness during the upcoming flu season. For the  2019-2020 influenza season, the vaccine is available in different forms. It's most often given as a shot into the muscle. A nasal spray is available for healthy, non-pregnant people between ages 2 and 49 years. A needle-free form called a jet injector delivers the vaccine through the skin into the muscle through a high-pressure stream. This form may be an option for some people ages 18 to 64. Your healthcare provider can tell you which vaccine is right for you.  · Wash your hands often. Frequent handwashing is a proven way to help prevent infection.  · Carry an alcohol-based hand gel containing at least 60% alcohol. Use it when you can't use soap and water. Then wash your hands as soon as you can.  · Try not to touch your eyes, nose, or mouth.  · At home and work, clean phones, computer keyboards, and toys often with disinfectant wipes.  · If possible, don't have close contact with others who have the flu or symptoms of the flu.    Handwashing tips  Handwashing is one of the best ways to prevent many common infections. If you are caring for or visiting someone with the flu, wash your hands each time you enter and leave the room. Follow these steps:   · Use clean, running water and plenty of soap. Rub your hands together well.  · Clean the whole hand, including under your nails, between your fingers, and up the wrists.  · Wash for at least 15 seconds.  · Rinse, letting the water run down your fingers, not up your wrists.  · Dry your hands well. Use a paper towel to turn off the faucet and open the door.  Using alcohol-based hand cleaners  Alcohol-based hand cleaners are also a good choice. Use them when you can't use soap and water. Follow these steps:   · Squeeze about a tablespoon of gel into the palm of one hand.  · Rub your hands together briskly, cleaning the backs of your hands, the palms, between your fingers, and up the wrists.  · Rub until the gel is gone and your hands are completely dry.  Preventing the  flu in healthcare settings   The flu is a special concern for people in hospitals and long-term care facilities. To help prevent the spread of flu, many hospitals and nursing homes take these steps:   · Healthcare providers wash their hands or use an alcohol-based hand cleaner before and after treating each patient.  · People with the flu have private rooms and bathrooms or share a room with someone with the same infection.  · People who are at high risk for the flu but don't have it are   encouraged to get the flu and pneumonia vaccines.  · All healthcare workers are encouraged or required to get flu shots.  [employer] last reviewed this educational content on 08/19/2015  © 2000-2019 [employer] [address], [place] [address]. All rights reserved. This information is not intended as a substitute for professional medical care. Always follow your healthcare professional's instructions.

## 2018-11-11 ENCOUNTER — Emergency Department: Payer: Commercial Managed Care - POS

## 2018-11-11 ENCOUNTER — Emergency Department
Admission: EM | Admit: 2018-11-11 | Discharge: 2018-11-11 | Disposition: A | Discharge status: Home / Self Care | Payer: Commercial Managed Care - POS | Attending: Emergency Medicine | Admitting: Emergency Medicine

## 2018-11-11 DIAGNOSIS — S022XXB Fracture of nasal bones, initial encounter for open fracture: Secondary | ICD-10-CM | POA: Insufficient documentation

## 2018-11-11 DIAGNOSIS — S0121XA Laceration without foreign body of nose, initial encounter: Secondary | ICD-10-CM

## 2018-11-11 DIAGNOSIS — Y93K1 Activity, walking an animal: Secondary | ICD-10-CM | POA: Insufficient documentation

## 2018-11-11 DIAGNOSIS — W010XXA Fall on same level from slipping, tripping and stumbling without subsequent striking against object, initial encounter: Secondary | ICD-10-CM | POA: Insufficient documentation

## 2018-11-11 DIAGNOSIS — R040 Epistaxis: Secondary | ICD-10-CM | POA: Insufficient documentation

## 2018-11-11 DIAGNOSIS — Z23 Encounter for immunization: Secondary | ICD-10-CM | POA: Insufficient documentation

## 2018-11-11 MED ORDER — LIDOCAINE 4 % EX CREA
TOPICAL_CREAM | Freq: Once | CUTANEOUS | Status: AC
Start: 2018-11-11 — End: 2018-11-11
  Administered 2018-11-11: 5 g via TOPICAL
  Filled 2018-11-11: qty 5

## 2018-11-11 MED ORDER — HYDROCODONE-ACETAMINOPHEN 5-325 MG PO TABS
1.0000 | ORAL_TABLET | Freq: Once | ORAL | Status: AC
Start: 2018-11-11 — End: 2018-11-11
  Administered 2018-11-11: 1 via ORAL
  Filled 2018-11-11: qty 1

## 2018-11-11 MED ORDER — CEPHALEXIN 500 MG PO CAPS
500.0000 mg | ORAL_CAPSULE | Freq: Two times a day (BID) | ORAL | 0 refills | Status: AC
Start: 2018-11-11 — End: 2018-11-16

## 2018-11-11 MED ORDER — CEPHALEXIN 500 MG PO CAPS
500.0000 mg | ORAL_CAPSULE | Freq: Once | ORAL | Status: AC
Start: 2018-11-11 — End: 2018-11-11
  Administered 2018-11-11: 500 mg via ORAL
  Filled 2018-11-11: qty 1

## 2018-11-11 MED ORDER — IBUPROFEN 600 MG PO TABS
600.0000 mg | ORAL_TABLET | Freq: Four times a day (QID) | ORAL | 0 refills | Status: DC | PRN
Start: 2018-11-11 — End: 2021-07-04

## 2018-11-11 MED ORDER — BACITRACIN +/- ZINC 500 UNIT/GM EX OINT (WRAP)
TOPICAL_OINTMENT | Freq: Once | CUTANEOUS | Status: AC
Start: 2018-11-11 — End: 2018-11-11
  Administered 2018-11-11: 1 g via TOPICAL
  Filled 2018-11-11: qty 1

## 2018-11-11 MED ORDER — TETANUS-DIPHTH-ACELL PERTUSSIS 5-2.5-18.5 LF-MCG/0.5 IM SUSP
0.5000 mL | Freq: Once | INTRAMUSCULAR | Status: AC
Start: 2018-11-11 — End: 2018-11-11
  Administered 2018-11-11: 0.5 mL via INTRAMUSCULAR
  Filled 2018-11-11: qty 0.5

## 2018-11-11 MED ORDER — ONDANSETRON 4 MG PO TBDP
4.0000 mg | ORAL_TABLET | Freq: Once | ORAL | Status: AC
Start: 2018-11-11 — End: 2018-11-11
  Administered 2018-11-11: 4 mg via ORAL
  Filled 2018-11-11: qty 1

## 2018-11-11 MED ORDER — LIDOCAINE HCL (PF) 2 % IJ SOLN
5.0000 mL | Freq: Once | INTRAMUSCULAR | Status: AC
Start: 2018-11-11 — End: 2018-11-11
  Administered 2018-11-11: 5 mL via SUBCUTANEOUS
  Filled 2018-11-11: qty 5

## 2018-11-11 MED ORDER — LETS KIT
3.0000 mL | PACK | Freq: Once | Status: DC
Start: 2018-11-11 — End: 2018-11-11

## 2018-11-11 NOTE — Discharge Instructions (Signed)
Nasal Fracture    Lacerations were closed with sutures.  The suture should be removed within 5 days.  You may return here for suture removal, however you should follow-up with the cosmetic surgeon listed below as discussed.  Continue the antibiotic tablets and ointment.  Take Motrin 600 mg with meals, take Norco for breakthrough pain.  Caution: Norco contain sedating narcotic as well as Tylenol, take only as directed.      You have been diagnosed with a nasal fracture (broken nose).    Usually, a doctor can diagnose a broken nose by examination only. An x-ray is rarely needed.    Often after an injury, the nose gets very swollen and painful. Whether or not the nasal bone is broken, if the nose looks "out of place" or crooked, a cosmetic (plastic) surgeon may need to repair it. If the nose is broken but not crooked or "out of place," no special treatment is needed. It can take at least 2 weeks for nose swelling to go down enough to tell if the nose needs surgery to make it straight.    Sometimes the nasal bone must be repositioned (moved). This is often done later by a specialist. The doctor here or your regular doctor may refer you to a specialist for follow-up.    You will have some swelling and bruising over the next several weeks. Placing an ice bag over the injured area may keep swelling down. Put some ice cubes in a re-sealable (Ziploc) bag and add some water. Put a thin washcloth between the bag and the skin. Apply the ice bag to the area for at least 20 minutes. Do this at least 4 times per day. Longer times and more often are OK. NEVER APPLY ICE DIRECTLY TO THE SKIN.    Avoid sneezing or blowing your nose too hard. This may cause swelling of the face or a bloody nose.    If your nose bleeds, pinch the entire nose for 20 minutes. If your nose is still bleeding after squeezing it for 20 minutes, seek medical attention.    Putting ice on the forehead or the back of the neck WILL NOT stop a bloody  nose.    YOU SHOULD SEEK MEDICAL ATTENTION IMMEDIATELY, EITHER HERE OR AT THE NEAREST EMERGENCY DEPARTMENT, IF ANY OF THE FOLLOWING OCCURS:   Bleeding doesn't stop with direct pressure.   Headaches or double vision develop.   Any signs of a head injury develop. These include confusion or lethargy (overly sleepy or trouble waking from sleep).

## 2018-11-11 NOTE — ED Provider Notes (Signed)
Physician/Midlevel provider first contact with patient: 11/11/18 2024         History     Chief Complaint   Patient presents with    Facial Injury     Patient presents the emergency department complaining of accidental mechanical fall with facial trauma.  Patient was walking her dog and got tangled in the leash, fell forward and struck her nose on a curb.  Now with pain and bleeding.  No loss of consciousness, no preceding chest pain, weakness or lightheadedness.  Not on blood thinners.  Bleeding controlled direct pressure.  No other facial pain.  No difficulty speaking swallowing or breathing, no dental pain.  Unknown last tetanus status.      The history is provided by the patient and the spouse.        Nursing (triage) note reviewed for the following pertinent information:  nose injury    History reviewed. No pertinent past medical history.    Past Surgical History:   Procedure Laterality Date    CESAREAN SECTION      x3     TUBAL LIGATION      2010       Family History   Problem Relation Age of Onset    Heart disease Mother         CHF    Heart disease Father     Cancer Maternal Aunt         breast    Cancer Maternal Grandmother     Cancer Paternal Grandmother        Social  Social History     Tobacco Use    Smoking status: Never Smoker    Smokeless tobacco: Never Used   Substance Use Topics    Alcohol use: Yes    Drug use: No       .     No Known Allergies    Home Medications     Med List Status:  In Progress Set By: Marylynn Pearson, LPN at 16/06/9603  8:20 PM                      Flagged for Removal             sulfamethoxazole-trimethoprim (BACTRIM DS,SEPTRA DS) 800-160 MG per tablet     TK 1 T PO BID FOR 5 DAYS           Review of Systems   Constitutional: Positive for activity change.   HENT: Positive for facial swelling. Negative for congestion, ear pain, rhinorrhea, sinus pain, sore throat and voice change.    Respiratory: Negative.    Cardiovascular: Negative.    Gastrointestinal: Negative.     Skin: Positive for wound. Negative for color change, pallor and rash.   Neurological: Negative.    All other systems reviewed and are negative.      Physical Exam    BP: 136/66, Heart Rate: 79, Temp: (!) 96 F (35.6 C), Resp Rate: 20, SpO2: 97 %, Weight: 103.4 kg    Physical Exam  Vitals signs and nursing note reviewed.   Constitutional:       General: She is not in acute distress.     Appearance: Normal appearance. She is not ill-appearing, toxic-appearing or diaphoretic.   HENT:      Head: Normocephalic.      Right Ear: Hearing and tympanic membrane normal.      Left Ear: Hearing and tympanic membrane normal.  Nose: Signs of injury, laceration and nasal tenderness present. No nasal deformity, septal deviation, mucosal edema, congestion or rhinorrhea.      Right Nostril: Epistaxis present. No foreign body, septal hematoma or occlusion.      Left Nostril: Epistaxis present. No foreign body, septal hematoma or occlusion.      Right Sinus: No maxillary sinus tenderness or frontal sinus tenderness.      Left Sinus: No maxillary sinus tenderness or frontal sinus tenderness.        Comments: Swelling and tenderness overlying anterior nose with 2 separate irregular lacerations totaling about 2.5 cm deep to subcutaneous tissue.  Tenderness right nasal bone greater than left without wheezing or deformity.  Nares are patent with resolving epistaxis, no foreign body or hematoma.     Mouth/Throat:      Lips: Pink.      Mouth: Mucous membranes are moist.      Dentition: Normal dentition.      Pharynx: Oropharynx is clear. Uvula midline.   Eyes:      Pupils: Pupils are equal, round, and reactive to light.   Neck:      Musculoskeletal: Full passive range of motion without pain, normal range of motion and neck supple.   Cardiovascular:      Rate and Rhythm: Normal rate.   Musculoskeletal:      Right shoulder: Normal.      Left shoulder: Normal.      Right elbow: Normal.     Left elbow: Normal.      Right wrist: Normal.       Left wrist: Normal.      Right hip: Normal.      Left hip: Normal.      Right knee: Normal.      Left knee: Normal.      Right ankle: Normal.      Left ankle: Normal.      Cervical back: Normal.      Thoracic back: Normal.      Lumbar back: Normal.   Skin:     General: Skin is warm.      Capillary Refill: Capillary refill takes less than 2 seconds.   Neurological:      General: No focal deficit present.      Mental Status: She is alert.      GCS: GCS eye subscore is 4. GCS verbal subscore is 5. GCS motor subscore is 6.      Cranial Nerves: Cranial nerves are intact. No cranial nerve deficit.      Sensory: Sensation is intact.      Motor: Motor function is intact.      Comments: 5/5 strength with resisted FROM, soft touch sensation preserved throughout extremities.  < 2 sec cap refill with bounding distal pulses.      Psychiatric:         Mood and Affect: Mood normal.           MDM and ED Course     ED Medication Orders (From admission, onward)    Start Ordered     Status Ordering Provider    11/11/18 2200 11/11/18 2152  cephalexin (KEFLEX) capsule 500 mg  Once     Route: Oral  Ordered Dose: 500 mg     Last MAR action:  Given Beckham Buxbaum EDWIN    11/11/18 2200 11/11/18 2152  bacitracin ointment  Once     Route: Topical     Last MAR action:  Given Avenly Roberge,  Yunior Jain EDWIN    11/11/18 2200 11/11/18 2152  HYDROcodone-acetaminophen (NORCO) 5-325 MG per tablet 1 tablet  Once     Route: Oral  Ordered Dose: 1 tablet     Last MAR action:  Meds to Go - ED Use Only Kaeleigh Westendorf EDWIN    11/11/18 2200 11/11/18 2152  HYDROcodone-acetaminophen (NORCO) 5-325 MG per tablet 1 tablet  Once     Route: Oral  Ordered Dose: 1 tablet     Last MAR action:  Given Estel Scholze EDWIN    11/11/18 2200 11/11/18 2152  ondansetron (ZOFRAN-ODT) disintegrating tablet 4 mg  Once     Route: Oral  Ordered Dose: 4 mg     Last MAR action:  Given Perina Salvaggio EDWIN    11/11/18 2045 11/11/18 2031  tetanus-diphth-acell pertussis (BOOSTRIX) injection 0.5 mL  Once     Route:  Intramuscular  Ordered Dose: 0.5 mL     Last MAR action:  Given Rembert Browe EDWIN    11/11/18 2045 11/11/18 2031    Once     Route: Topical  Ordered Dose: 3 mL     Discontinued Tanicia Wolaver EDWIN    11/11/18 2045 11/11/18 2031  lidocaine (PF) (XYLOCAINE) 2 % injection 5 mL  Once     Route: Subcutaneous  Ordered Dose: 5 mL     Last MAR action:  Given by Other Ayush Boulet EDWIN    11/11/18 2045 11/11/18 2041  lidocaine (LMX) 4 % cream  Once     Route: Topical     Last MAR action:  Given Jalen Oberry EDWIN             MDM  Number of Diagnoses or Management Options  Epistaxis: new and requires workup  Fall due to stumbling, initial encounter: new and requires workup  Laceration of nose, initial encounter: new and requires workup  Open fracture of nasal bone, initial encounter: new and requires workup  Diagnosis management comments:   I, Adelene Idler, Physician Assistant, have been the primary provider for this pt during their ED stay.     The attending signature signifies review and agreement of the history, physical examination, evaluation, clinical impression and plan except as noted.     02 sat is 100% on  ra, which is nml.     Patient with accidental mechanical ground-level fall with laceration and tenderness to nose.    Obvious epistaxis and lacerations.  Check plain films to evaluate for fracture.  No other injury, no preceding weakness lightheadedness or illness to require additional imaging or work-up.  No altered mental status according to family.    Lab/Rad results reviewed include:     XR Nasal Bones   Final Result        1. Probable nondisplaced fracture mid nasal bone and possible fracture    at the tip of the nasal bone.    2. Fluid level within the left maxillary sinus which could be hemorrhage    in the setting of trauma or sinusitis.        Charlott Rakes, MD     11/11/2018 8:49 PM     2100 -discussed with on-call ENT Dr. Jerlyn Ly.  Discussed x-ray report, patient exam and history, to include concerned about an open  fracture.  He believes the wound may be approximated here, follow-up with cosmetic surgery, supportive care.  Additional guidance at this time.    Patient tolerates closure well, no bleeding or discharge, ambulating well.  Understands need to follow-up with plastics, 5-day suture removal.    Pt given typical ED return precautions with worsening or changing symptoms, and verbalizes understanding of need for follow-up.     *This note was generated by the Epic EMR system/ Dragon speech recognition and may contain inherent errors or omissions not intended by the user. Grammatical errors, random word insertions, deletions, pronoun errors and incomplete sentences are occasional consequences of this technology due to software limitations. Not all errors are caught or corrected. If there are questions or concerns about the content of this note or information contained within the body of this dictation they should be addressed directly with the author for clarification           Amount and/or Complexity of Data Reviewed  Tests in the radiology section of CPT: ordered and reviewed  Obtain history from someone other than the patient: yes  Review and summarize past medical records: yes  Discuss the patient with other providers: yes    Risk of Complications, Morbidity, and/or Mortality  Presenting problems: moderate  Diagnostic procedures: moderate  Management options: moderate    Patient Progress  Patient progress: improved                   Lac Repair  Date/Time: 11/11/2018 10:42 PM  Performed by: Jamse Arn, PA  Authorized by: Guadalupe Maple, MD     Consent:     Consent obtained:  Verbal    Consent given by:  Patient    Risks discussed:  Infection, poor cosmetic result, poor wound healing and pain    Alternatives discussed:  No treatment  Anesthesia (see MAR for exact dosages):     Anesthesia method:  Topical application and local infiltration    Topical anesthesia: LMX     Local anesthetic:  Lidocaine 2%  w/o epi  Laceration details:     Location:  Face    Face location:  Nose    Length (cm):  2.5  Repair type:     Repair type:  Simple  Pre-procedure details:     Preparation:  Patient was prepped and draped in usual sterile fashion and imaging obtained to evaluate for foreign bodies  Exploration:     Hemostasis achieved with:  Direct pressure    Wound exploration: wound explored through full range of motion and entire depth of wound probed and visualized      Wound extent: no foreign bodies/material noted, no muscle damage noted, no nerve damage noted, no tendon damage noted, no underlying fracture noted and no vascular damage noted      Contaminated: no    Treatment:     Area cleansed with:  Saline    Amount of cleaning:  Extensive    Irrigation method:  Pressure wash  Skin repair:     Repair method:  Sutures    Suture size:  6-0    Suture material:  Prolene    Suture technique:  Simple interrupted    Number of sutures:  6  Approximation:     Approximation:  Close  Post-procedure details:     Dressing:  Adhesive bandage and antibiotic ointment    Patient tolerance of procedure:  Tolerated well, no immediate complications        Clinical Impression & Disposition     Clinical Impression  Final diagnoses:   Fall due to stumbling, initial encounter   Open fracture of nasal bone, initial encounter   Laceration of nose,  initial encounter   Epistaxis        ED Disposition     ED Disposition Condition Date/Time Comment    Discharge  Mon Nov 11, 2018  9:53 PM Mickie Bail discharge to home/self care.    Condition at disposition: Stable           Discharge Medication List as of 11/11/2018  9:53 PM      START taking these medications    Details   cephalexin (KEFLEX) 500 MG capsule Take 1 capsule (500 mg total) by mouth 2 (two) times daily for 5 days, Starting Mon 11/11/2018, Until Sat 11/16/2018, Print      ibuprofen (ADVIL,MOTRIN) 600 MG tablet Take 1 tablet (600 mg total) by mouth every 6 (six) hours as needed for Pain or  Fever, Starting Mon 11/11/2018, OTC                       Jamse Arn, PA  11/11/18 2245       Guadalupe Maple, MD  11/14/18 [phone]

## 2018-11-11 NOTE — ED Triage Notes (Addendum)
Pt ambulatory into ED for c/o nose injury. Pt was walking her dogs and tripped causing her to fall forward hitting her face on a curb. Denies loc, ha, confusion, n/v, or dizziness.

## 2018-11-11 NOTE — ED Provider Notes (Signed)
I, Vic Ripper MD, have discussed with the APP, and personally seen and examined this patient, and have fully participated in their care.    Brief HPI: fall forward, mechanical ground level fall today. Nasal laceration and swelling. Denies loc/cp/sob.      PHYSICAL EXAM    HEENT: PERRL, EOMI, airway intact, nasal lac and fx. No septal hematoma  MUSC: FROM, NVI  Neuro: aaox4, cn 2-12 grossly intact        I/P:    Results     ** No results found for the last 24 hours. **        Radiology Results (24 Hour)     Procedure Component Value Units Date/Time    XR Nasal Bones [[id number]] Collected:  11/11/18 2046    Order Status:  Completed Updated:  11/11/18 2053    Narrative:       History: Trauma with pain.    There is buckling of the mid nasal bone with imaging appearance  suggestive of nondisplaced fracture. There may also be a fracture at the  very tip of the nasal bone. Cortical regularity is identified along the  left aspect of the nasal bone on the frontal view. There is a left-sided  nasal septal spur. Air-fluid level is seen within the left maxillary  sinus.      Impression:         1. Probable nondisplaced fracture mid nasal bone and possible fracture  at the tip of the nasal bone.  2. Fluid level within the left maxillary sinus which could be hemorrhage  in the setting of trauma or sinusitis.    Charlott Rakes, MD   11/11/2018 8:49 PM          This note was generated by the Epic EMR system/Dragon speech recognition and may contain inherent errors or omissions not intended by the user. Grammatical errors, random word insertions, deletions, pronoun errors and incomplete sentences are occasional consequences of this technology due to software limitations. Not all errors are caught or corrected. If there are questions or concerns about the content of this note or information contained within the body of this dictation they should be addressed directly with the author for clarification.      ent dr Jerlyn Ly aware,  deferred to plastic surg f/u. Sutured, rx keflex. Crozet'd home.     Guadalupe Maple, MD  11/11/18 2155

## 2018-11-12 ENCOUNTER — Telehealth (INDEPENDENT_AMBULATORY_CARE_PROVIDER_SITE_OTHER): Payer: Self-pay | Admitting: Family Medicine

## 2018-11-12 NOTE — Telephone Encounter (Signed)
Attempted to call patient for ED follow up visit. LMTCB.

## 2018-11-15 ENCOUNTER — Encounter (INDEPENDENT_AMBULATORY_CARE_PROVIDER_SITE_OTHER): Payer: Self-pay | Admitting: Specialist

## 2018-11-15 ENCOUNTER — Ambulatory Visit (INDEPENDENT_AMBULATORY_CARE_PROVIDER_SITE_OTHER): Payer: Commercial Managed Care - POS | Admitting: Specialist

## 2018-11-15 VITALS — BP 132/80 | HR 97 | Temp 98.6°F | Wt 224.4 lb

## 2018-11-15 DIAGNOSIS — S0121XD Laceration without foreign body of nose, subsequent encounter: Secondary | ICD-10-CM

## 2018-11-15 DIAGNOSIS — S022XXD Fracture of nasal bones, subsequent encounter for fracture with routine healing: Secondary | ICD-10-CM

## 2018-11-15 DIAGNOSIS — S0083XD Contusion of other part of head, subsequent encounter: Secondary | ICD-10-CM

## 2018-11-15 NOTE — Progress Notes (Signed)
Have you seen any specialists/other providers since your last visit with us?    No    Arm preference verified?   Yes    The patient is due for mammogram

## 2018-11-15 NOTE — Progress Notes (Signed)
Subjective:      Date: 11/15/2018 3:56 PM   Patient ID: Maria Rowe is a 48 y.o. female.    Chief Complaint:  Chief Complaint   Patient presents with    Fall     f/u after recent ED  visit       HPI:  HPI    Patient is here for follow-up for laceration on the nose, nasal bone fracture, epistaxis and contusion of the face.  Patient fell on her face on the curb and her legs got tangled in the leash of her dog 3 and half days ago.  She immediately went to emergency room where she got laceration on the nose repaired.  She had epistaxis as well at the time and that has been resolved.  She  has appointment coming up with plastic surgeon in few days and planning to get the stitches out then.       Patient has some pain on the nose which has been gradually getting better since the injury.  Denies any pus discharge from the wound on the nose.  Denies headache or vision changes.  Denies any more episodes of bleeding from the nose.  She has been taking Keflex as prescribed to prevent the infection on for possible sinusitis (x-ray showed a fluid level from the trauma or sinusitis )      Problem List:  There is no problem list on file for this patient.      Current Medications:  Outpatient Medications Marked as Taking for the 11/15/18 encounter (Office Visit) with Jann Milkovich, Rondel Jumbo, MD   Medication Sig Dispense Refill    sulfamethoxazole-trimethoprim (BACTRIM DS,SEPTRA DS) 800-160 MG per tablet TK 1 T PO BID FOR 5 DAYS         Allergies:  No Known Allergies    Past Medical History:  History reviewed. No pertinent past medical history.    Past Surgical History:  Past Surgical History:   Procedure Laterality Date    CESAREAN SECTION      x3     TUBAL LIGATION      2010       Family History:  Family History   Problem Relation Age of Onset    Heart disease Mother         CHF    Heart disease Father     Cancer Maternal Aunt         breast    Cancer Maternal Grandmother     Cancer Paternal Grandmother        Social  History:  Social History     Tobacco Use    Smoking status: Never Smoker    Smokeless tobacco: Never Used   Substance Use Topics    Alcohol use: Yes    Drug use: No         The following sections were reviewed this encounter by the provider:   Tobacco   Allergies   Meds   Problems   Med Hx   Surg Hx   Fam Hx          ROS:  Review of Systems   Constitutional: Negative for chills, fever and unexpected weight change.   HENT: Positive for facial swelling. Negative for congestion and nosebleeds.    Eyes: Negative for visual disturbance.   Respiratory: Negative for cough and shortness of breath.    Cardiovascular: Negative for chest pain and palpitations.   Neurological: Negative for dizziness and headaches.   All other  systems reviewed and are negative.       Objective:     Vitals:  BP 132/80    Pulse 97    Temp 98.6 F (37 C) (Oral)    Wt 101.8 kg (224 lb 6.4 oz)    SpO2 96%    BMI 38.52 kg/m     Physical Exam:  Physical Exam  Vitals signs reviewed.   Constitutional:       Appearance: Normal appearance. She is normal weight.   HENT:      Head: Normocephalic. Contusion present.        Comments: Stitches intact.  No signs of infection.     Nose: Signs of injury and laceration present.      Right Nostril: No epistaxis.      Right Sinus: No frontal sinus tenderness.      Left Sinus: No frontal sinus tenderness.   Cardiovascular:      Rate and Rhythm: Normal rate and regular rhythm.      Heart sounds: Normal heart sounds.   Pulmonary:      Effort: Pulmonary effort is normal.      Breath sounds: Normal breath sounds. No wheezing, rhonchi or rales.   Skin:     General: Skin is warm and dry.   Neurological:      Mental Status: She is alert and oriented to person, place, and time. Mental status is at baseline.   Psychiatric:         Mood and Affect: Mood normal.         Behavior: Behavior normal.         Thought Content: Thought content normal.         Assessment/Plan:       1. Laceration of nose, subsequent encounter     Improving  Continue wound care.  Continue Keflex.  Keep that appointment with plastic surgeon in couple of days.    2. Contusion of face, subsequent encounter    Resolving.    3. Closed fracture of nasal bone with routine healing, subsequent encounter    Stable.    No follow-ups on file.    Karlyn Agee, MD

## 2018-11-18 ENCOUNTER — Ambulatory Visit (INDEPENDENT_AMBULATORY_CARE_PROVIDER_SITE_OTHER): Payer: Commercial Managed Care - POS | Admitting: Family Medicine

## 2020-01-12 ENCOUNTER — Other Ambulatory Visit (INDEPENDENT_AMBULATORY_CARE_PROVIDER_SITE_OTHER): Payer: Self-pay | Admitting: Family Medicine

## 2020-05-27 ENCOUNTER — Other Ambulatory Visit (INDEPENDENT_AMBULATORY_CARE_PROVIDER_SITE_OTHER): Payer: Self-pay | Admitting: Family Medicine

## 2020-09-24 ENCOUNTER — Encounter (INDEPENDENT_AMBULATORY_CARE_PROVIDER_SITE_OTHER): Payer: Self-pay | Admitting: Family Medicine

## 2020-09-24 ENCOUNTER — Telehealth (INDEPENDENT_AMBULATORY_CARE_PROVIDER_SITE_OTHER): Payer: Commercial Managed Care - POS | Admitting: Family Medicine

## 2020-09-24 DIAGNOSIS — R059 Cough, unspecified: Secondary | ICD-10-CM

## 2020-09-24 NOTE — Progress Notes (Signed)
Subjective:      Date: 09/24/2020 8:18 AM   Patient ID: Maria Rowe is a 50 y.o. female.    Verbal consent has been obtained from the patient to conduct this video visit encounter to minimize exposure to COVID-19. Patient was positively identified and was verified to be located in the state of [place] at the time of the visit     Chief Complaint:  Chief Complaint   Patient presents with    Covid-19 Screening       HPI:  HPI   50yo F presents for congestion, cough, sore throat onset yesterday.  Also with chills.  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 No specific exposure.        COVID-19 Questionnaire:    Fever: no  Cough: yes  Dyspnea: no  Additional Symptoms:  fatigue, sore throat and chills  International travel to countries that have a Level 3 Health Notice ([email or web address]) in the past 14 days:  no  Travel history inside the continental [place] within past 14 days ([place], [place], [place], [place], [place], [place]:  no  Close contact with a laboratory confirmed COVID-19 person: no  Therapist, occupational:  Museum/gallery curator  Immunocompromised:  no immunocompromising conditions  Long term facility resident: No  Sole provider for dependent at high risk:  no  Pregnant 3rd trimester or HRP: No        Problem List:  There is no problem list on file for this patient.      Current Medications:  No outpatient medications have been marked as taking for the 09/24/20 encounter (Telemedicine Visit) with Toma Deiters, MD.       Allergies:  No Known Allergies    Past Medical History:  History reviewed. No pertinent past medical history.    Past Surgical History:  Past Surgical History:   Procedure Laterality Date    CESAREAN SECTION      x3     TUBAL LIGATION      2010       Family History:  Family History   Problem Relation Age of Onset    Heart disease Mother         CHF    Heart disease Father     Cancer Maternal  Aunt         breast    Cancer Maternal Grandmother     Cancer Paternal Grandmother        Social History:  Social History     Tobacco Use    Smoking status: Never Smoker    Smokeless tobacco: Never Used   Haematologist Use: Never used   Substance Use Topics    Alcohol use: Yes    Drug use: No         The following sections were reviewed this encounter by the provider:   Tobacco   Allergies   Meds   Problems   Med Hx   Surg Hx   Fam Hx            ROS:  Review of Systems   General/Constitutional:   Admits Chills. Admits Fatigue. Denies Fever.   ENT:   Admits congestion. Admits Sore throat.   Respiratory:   Admits Cough. Denies Shortness of breath. Denies Wheezing.   Cardiovascular:   Denies Chest pain.  Gastrointestinal:   Denies Abdominal pain. Denies Diarrhea. Denies Nausea. Denies  Vomiting.  Neurologic:   Denies Dizziness. Denies Headache.     Objective:   Vitals:  There were no vitals taken for this visit.      Physical Exam:  General Examination:   Physical Exam   GENERAL APPEARANCE: alert, in no acute distress  PULMONARY: effort normal, no respiratory distress  MUSCULOSKELETAL: ROM normal  PSYCHIATRIC/BEHAVIORAL: mood normal, thought content normal    Assessment:       1. Cough  - COVID-19 SYMPTOMATIC Patient; Future      Time spent in discussion: 11 to 20 minutes   Plan:      COVID-19 Plan:    Status:   Mild symptoms with no known exposure to COVID-19 patient (community exposure)  Diagnostics:  COVID-19 lab order generated. Advised patient to come into the office for COVID-19 test.    Treatment:    advil/tylenol PRN for pain/fever and OTC Mucinex DM twice a day as needed  Recommendations:   Self-care instructions provided to patient. Remain isolated until test results return- if result returns positive, patient advised to remain isolated until:  You have had no fever for at least 24 hours (without the use medicine that reduces fevers)  AND  other symptoms have improved (for example, when your  cough or shortness of breath have improved)  AND  at least 5 days have passed since your symptoms first appeared.  If coming off isolation after 5 days, you must wear a mask for another 5 days when around others  Pt instructed to seek immediate medical attention if she develops worsening cough, shortness of breath, symptoms of hypoxia, or confusion.             Toma Deiters, MD

## 2020-09-24 NOTE — Addendum Note (Signed)
Addended by: Aretta Nip on: 09/24/2020 09:46 AM     Modules accepted: Orders

## 2020-09-24 NOTE — Patient Instructions (Addendum)
COVID-19: Patient Instructions (home isolation)    Stay home if you are sick. Stay home if you have symptoms of a fever, cough, or shortness of breath.   People who are mildly ill with COVID-19 are able to recover at home. Do not leave, except to get medical care. Do not visit public areas.  If a member of your household is sick, stay home from school and work to avoid spreading COVID-19 to others.  If your children are in the care of others, urge caregivers to watch for COVID-19 symptoms.    Continue practicing everyday preventive actions. Cover coughs and sneezes with a tissue and wash your hands often with soap and water for at least 20 seconds. If soap and water are not available, use a hand sanitizer that contains 60% alcohol. Clean frequently touched surfaces and objects daily using a regular household detergent and water.    Use the separate room and bathroom you prepared for sick household members (if possible).  Separate yourself from other people in your home, this is known as home isolation. Stay away from others: As much as possible, you should stay in a specific “sick room” and away from other people in your home. Avoid sharing personal items like food and drinks. Provide your sick household member with clean disposable facemasks to wear at home, if available, to help prevent spreading COVID-19 to others. Clean the sick room and bathroom, as needed, to avoid unnecessary contact with the sick person.  If surfaces are dirty, they should be cleaned using a detergent and water prior to disinfection. For disinfection, a list of products with EPA-approved emerging viral pathogens claims, maintained by the CDC (See CDC website for more information).  Always follow the manufacturer’s instructions for all cleaning and disinfection products.    Stay in touch with others by phone or email. If you live alone and become sick during a COVID-19 outbreak, you may need help. If you have a chronic medical condition and  live alone, ask family, friends, and health care providers to check on you during an outbreak. Stay in touch with family and friends with chronic medical conditions.     Call ahead before visiting your doctor  Call ahead: If you have a medical appointment, call your doctor’s office or emergency department, and tell them you have or may have COVID-19. This will help the office protect themselves and other patients.    Wear a facemask if you are sick  If you are sick: You should wear a facemask when you are around other people and before you enter a healthcare provider’s office.  If you are caring for others: If the person who is sick is not able to wear a facemask (for example, because it causes trouble breathing), then people who live in the home should stay in a different room. When caregivers enter the room of the sick person, they should wear a facemask. Visitors, other than caregivers, are not recommended.    Monitor your symptoms  If you develop emergency warning signs for COVID-19 get medical attention immediately. Emergency warning signs include:  Difficulty breathing or shortness of breath  Persistent pain or pressure in the chest  New confusion or inability to arouse  Bluish lips or face  Contact your primary care office if you have any questions or concerns.  Call your doctor before going in: Before going to the doctor’s office or emergency room, call ahead and tell them your symptoms. They will tell you what to   do.  If possible, put on a facemask before you enter the building. If you can’t put on a facemask, try to keep a safe distance from other people (at least 6 feet away). This will help protect the people in the office or waiting room.    Discontinuing home isolation:  People with a negative COVID-19 test can discontinue home isolation.    People with confirmed COVID-19 or suspected COVID-19 infection who have stayed home (home isolated) can stop home isolation under the following conditions:   If  you will not have a test to determine if you are still contagious, you can leave home after these three things have happened:  You have had no fever for at least 24 hours (without the use medicine that reduces fevers)  AND  other symptoms have improved (for example, when your cough or shortness of breath have improved)  AND  at least 5 days have passed since your symptoms first appeared    If coming off isolation after 5 days, you must wear a mask for another 5 days when around others        If you are sick with COVID-19 or think you might have COVID-19; stay home, rest, and separate yourself from other people in the home as much as possible.   Most people will develop mild to moderate symptoms, such as fever and cough, that will get better without medical help. By staying home, you reduce the chance of spreading COVID-19 to others, including healthcare workers who are needed to care for the more seriously ill. If you require medical attention, call ahead. The most common symptoms are fever, cough, and shortness of breath. Other common symptoms include fatigue, decreased appetite, and muscle or body aches. Not everyone with COVID-19 will have all symptoms and fever might not be present. Get rest and drink plenty of water or clear liquids. Avoid alcohol or drinks with caffeine, such as sodas, tea, and coffee.     Self-isolate for at least 24 hours after you no longer have a fever (without the use of fever-reducing medications) AND other symptoms are greatly improved AND at least 5 days have passed since symptoms first started. If coming off isolation after 5 days, you must wear a mask for another 5 days when around others. Treat symptoms with non-prescription medicines and call ahead before visiting our practice.    Use over-the-counter medications based on your symptoms. Follow all usage and warning information on the label. There is no specific treatment for COVID-19. There is no specific antiviral treatment  recommended for COVID-19. People with COVID-19 should receive supportive care to help relieve symptoms.     - Stuffy/runny nose - Use a nasal decongestant that contains phenylephrine or pseudoephedrine (e.g, Sudafed), saline nasal spray, or oral antihistamines (e.g., Claritin or Zyrtec)  - Dry cough (without mucus) - Use a cough suppressant that contains dextromethorphan (e.g., Delsym)  - Productive cough (wet cough with mucus) - Use an expectorant that contains guaifenesin (e.g., Robitussin or Mucinex)Both productive and dry cough - Use a combination guaifenesin/dextromethorphan product (e.g., Mucinex DM or Robitussin DM)   - Sore throat - Use a mild pain reliever such as acetaminophen (Tylenol), throat sprays like chloraseptic spray, or cough drops  - Fever or headache or body aches - Use a mild pain reliever such as acetaminophen (Tylenol) or ibuprofen (e.g., Advil or Motrin)    Seek medical attention immediately if your illness is getting worse (e.g., difficulty breathing or persistent fever after   using fever reducing medication). Call the doctor’s office and tell them you have or may have COVID-19. Get immediate medical attention if you have emergency signs (e.g., pain or pressure in the chest that doesn’t go away, new confusion or inability to arouse, or bluish lips or face).

## 2020-09-25 LAB — COVID-19 (SARS-COV-2): SARS CoV 2 Overall Result: NOT DETECTED

## 2021-02-05 ENCOUNTER — Encounter (INDEPENDENT_AMBULATORY_CARE_PROVIDER_SITE_OTHER): Payer: Self-pay

## 2021-02-05 ENCOUNTER — Ambulatory Visit (INDEPENDENT_AMBULATORY_CARE_PROVIDER_SITE_OTHER): Payer: Commercial Managed Care - POS | Admitting: Family Medicine

## 2021-02-05 VITALS — BP 133/90 | HR 81 | Temp 99.5°F | Resp 14 | Ht 64.0 in | Wt 180.0 lb

## 2021-02-05 DIAGNOSIS — U071 COVID-19: Secondary | ICD-10-CM

## 2021-02-05 NOTE — Progress Notes (Signed)
[facility]  PROGRESS NOTE     Patient: Maria Rowe   Date: 02/05/2021   MRN: [id number]       RENDI MAPEL is a 50 y.o. female      HISTORY     History obtained from: Patient    Chief Complaint   Patient presents with   . URI     Home Covid pos this AM. Symptoms began Thursday. Congestion, fatigue, sore throat. Mucinex DM OTC. Covid vax x3. Wants symptom support.          URI   This is a new problem. Episode onset: 2 days ago. The problem has been unchanged. There has been no fever. Associated symptoms include congestion, coughing, rhinorrhea, sneezing and a sore throat. She has tried nothing for the symptoms.        Review of Systems   HENT: Positive for congestion, rhinorrhea, sneezing and sore throat.    Respiratory: Positive for cough.    All other systems reviewed and are negative.      History:    Pertinent Past Medical, Surgical, Family and Social History were reviewed.        Current Outpatient Medications:   .  ibuprofen (ADVIL,MOTRIN) 600 MG tablet, Take 1 tablet (600 mg total) by mouth every 6 (six) hours as needed for Pain or Fever, Disp: , Rfl: 0  .  sulfamethoxazole-trimethoprim (BACTRIM DS,SEPTRA DS) 800-160 MG per tablet, TK 1 T PO BID FOR 5 DAYS, Disp: , Rfl:     No Known Allergies    Medications and Allergies reviewed.    PHYSICAL EXAM     Vitals:    02/05/21 1147   BP: 133/90   Pulse: 81   Resp: 14   Temp: 99.5 F (37.5 C)   TempSrc: Tympanic   SpO2: 97%   Weight: 81.6 kg (180 lb)   Height: 1.626 m (5\' 4" )       Physical Exam  Constitutional:       Appearance: Normal appearance.   HENT:      Right Ear: Tympanic membrane normal.      Left Ear: Tympanic membrane normal.      Nose: Nose normal.      Mouth/Throat:      Pharynx: Oropharynx is clear.     Eyes: Conjunctivae are normal. Cardiovascular:      Rate and Rhythm: Normal rate and regular rhythm.      Pulses: Normal pulses.      Heart sounds: Normal heart sounds.   Pulmonary:      Effort: Pulmonary effort is normal.      Breath  sounds: Normal breath sounds.   Musculoskeletal:      Cervical back: Neck supple.   Neurological:      Mental Status: She is alert.         UCC COURSE     There were no labs reviewed with this patient during the visit.      There were no x-rays reviewed with this patient during the visit.    No current facility-administered medications for this visit.       PROCEDURES     Procedures    MEDICAL DECISION MAKING     History, physical, labs/studies most consistent with COVID as the diagnosis.    Chart Review:  Prior PCP, Specialist and/or ED notes reviewed today: No  Prior labs/images/studies reviewed today: No    Differential Diagnosis:       ASSESSMENT  Encounter Diagnosis   Name Primary?   . COVID Yes            PLAN      PLAN: -Patient to follow up with primary care doctor or follow up here if symptoms worsening or not resolving as expected.   -Follow up with ED with acute worsening of symptoms.  -Patient verbalized understanding.   -Reviewed discharge instructions that are included here with patient, and printed in AVS.   -Questions answered.        No orders of the defined types were placed in this encounter.    Requested Prescriptions      No prescriptions requested or ordered in this encounter       Discussed results and diagnosis with patient/family.  Reviewed warning signs for worsening condition, as well as, indications for follow-up with primary care physician and return to urgent care clinic.   Patient/family expressed understanding of instructions.     An After Visit Summary was provided to the patient.

## 2021-06-08 ENCOUNTER — Emergency Department
Admission: EM | Admit: 2021-06-08 | Discharge: 2021-06-08 | Disposition: A | Discharge status: Home / Self Care | Payer: Commercial Managed Care - POS | Attending: Emergency Medicine | Admitting: Emergency Medicine

## 2021-06-08 DIAGNOSIS — M940 Chondrocostal junction syndrome [Tietze]: Secondary | ICD-10-CM | POA: Insufficient documentation

## 2021-06-08 DIAGNOSIS — R0781 Pleurodynia: Secondary | ICD-10-CM

## 2021-06-08 DIAGNOSIS — M5413 Radiculopathy, cervicothoracic region: Secondary | ICD-10-CM

## 2021-06-08 MED ORDER — DIAZEPAM 5 MG PO TABS
5.0000 mg | ORAL_TABLET | Freq: Once | ORAL | Status: AC
Start: 2021-06-08 — End: 2021-06-08
  Administered 2021-06-08: 5 mg via ORAL
  Filled 2021-06-08: qty 1

## 2021-06-08 MED ORDER — OXYCODONE-ACETAMINOPHEN 5-325 MG PO TABS
1.0000 | ORAL_TABLET | Freq: Once | ORAL | Status: AC
Start: 2021-06-08 — End: 2021-06-08
  Administered 2021-06-08: 1 via ORAL
  Filled 2021-06-08 (×2): qty 1

## 2021-06-08 MED ORDER — PREDNISONE 20 MG PO TABS
60.0000 mg | ORAL_TABLET | Freq: Every day | ORAL | 0 refills | Status: DC
Start: 2021-06-08 — End: 2021-07-04

## 2021-06-08 MED ORDER — PREDNISONE 20 MG PO TABS
60.0000 mg | ORAL_TABLET | Freq: Once | ORAL | Status: AC
Start: 2021-06-08 — End: 2021-06-08
  Administered 2021-06-08: 60 mg via ORAL
  Filled 2021-06-08: qty 3

## 2021-06-08 MED ORDER — OXYCODONE-ACETAMINOPHEN 5-325 MG PO TABS
1.0000 | ORAL_TABLET | ORAL | 0 refills | Status: AC | PRN
Start: 2021-06-08 — End: 2021-06-15

## 2021-06-08 MED ORDER — DIAZEPAM 5 MG PO TABS
5.0000 mg | ORAL_TABLET | Freq: Four times a day (QID) | ORAL | 0 refills | Status: DC | PRN
Start: 2021-06-08 — End: 2021-07-04

## 2021-06-08 MED ORDER — LIDOCAINE 5 % EX PTCH
1.0000 | MEDICATED_PATCH | CUTANEOUS | Status: DC
Start: 2021-06-08 — End: 2021-06-08
  Administered 2021-06-08: 1 via TRANSDERMAL
  Filled 2021-06-08: qty 1

## 2021-06-08 NOTE — ED Provider Notes (Signed)
History     Chief Complaint   Patient presents with    Neck Pain    Shoulder Pain     50 yof c/o right chest wall pain and neck pain. She went to urgent care and had a normal xray. Pain became worse. No numbness or weakness. No prior MRI.     The history is provided by the patient.   Neck Pain  Pain location:  R side  Quality:  Shooting  Pain radiates to:  Does not radiate  Pain severity:  Severe  Onset quality:  Sudden  Timing:  Constant  Progression:  Unchanged  Chronicity:  New  Context: not fall and not recent injury    Relieved by:  Nothing  Worsened by:  Position  Ineffective treatments:  None tried  Associated symptoms: no chest pain, no fever, no headaches and no weakness    Shoulder Pain  Pertinent negatives include no chest pain, no abdominal pain, no headaches and no shortness of breath.      History reviewed. No pertinent past medical history.    Past Surgical History:   Procedure Laterality Date    CESAREAN SECTION      x3     TUBAL LIGATION      2010       Family History   Problem Relation Age of Onset    Heart disease Mother         CHF    Heart disease Father     Cancer Maternal Aunt         breast    Cancer Maternal Grandmother     Cancer Paternal Grandmother        Social  Social History     Tobacco Use    Smoking status: Never    Smokeless tobacco: Never   Vaping Use    Vaping Use: Never used   Substance Use Topics    Alcohol use: Yes     Comment: occ    Drug use: No       .     No Known Allergies    Home Medications       Med List Status: In Progress Set By: Andee Poles, RN at 06/08/2021 12:34 AM              ibuprofen (ADVIL,MOTRIN) 600 MG tablet     Take 1 tablet (600 mg total) by mouth every 6 (six) hours as needed for Pain or Fever     sulfamethoxazole-trimethoprim (BACTRIM DS,SEPTRA DS) 800-160 MG per tablet     TK 1 T PO BID FOR 5 DAYS             Review of Systems   Constitutional:  Negative for chills and fever.   HENT:  Negative for rhinorrhea and sore throat.    Eyes:  Negative  for pain and discharge.   Respiratory:  Negative for cough and shortness of breath.    Cardiovascular:  Negative for chest pain and palpitations.   Gastrointestinal:  Negative for abdominal pain, nausea and vomiting.   Genitourinary:  Negative for dysuria and frequency.   Musculoskeletal:  Positive for arthralgias, myalgias and neck pain.   Skin:  Negative for color change and rash.   Neurological:  Negative for dizziness, weakness and headaches.   Psychiatric/Behavioral:  Negative for suicidal ideas. The patient is not nervous/anxious.      Physical Exam    BP: 151/65, Heart Rate: 84, Temp: 97.8 F (36.6 C),  Resp Rate: 18, SpO2: 96 %, Weight: 83 kg    Physical Exam  Vitals and nursing note reviewed.   Constitutional:       General: She is in acute distress.      Appearance: She is well-developed. She is not ill-appearing or diaphoretic.   HENT:      Head: Normocephalic and atraumatic.   Eyes:      General:         Right eye: No discharge.         Left eye: No discharge.      Conjunctiva/sclera: Conjunctivae normal.   Neck:     Cardiovascular:      Rate and Rhythm: Normal rate and regular rhythm.   Pulmonary:      Effort: Pulmonary effort is normal. No respiratory distress.   Chest:      Chest wall: No deformity, crepitus or edema.       Abdominal:      Palpations: Abdomen is soft.      Tenderness: There is no abdominal tenderness.   Musculoskeletal:         General: No tenderness.      Cervical back: Neck supple. No erythema, signs of trauma or tenderness. Muscular tenderness present.   Lymphadenopathy:      Cervical: No cervical adenopathy.   Skin:     General: Skin is warm and dry.      Findings: No rash.   Neurological:      General: No focal deficit present.      Mental Status: She is alert and oriented to person, place, and time.      Cranial Nerves: Cranial nerves are intact.   Psychiatric:         Mood and Affect: Mood normal.         Behavior: Behavior normal.         MDM and ED Course     ED Medication  Orders (From admission, onward)      Start Ordered     Status Ordering Provider    06/08/21 0045 06/08/21 0040    Every 24 hours        Route: Transdermal  Ordered Dose: 1 patch     Discontinued Javen Ridings J    06/08/21 0045 06/08/21 0041  oxyCODONE-acetaminophen (PERCOCET) 5-325 MG per tablet 1 tablet  Once        Route: Oral  Ordered Dose: 1 tablet     Last MAR action: Given Elyn Aquas J    06/08/21 0045 06/08/21 0041  diazePAM (VALIUM) tablet 5 mg  Once        Route: Oral  Ordered Dose: 5 mg     Last MAR action: Given Elyn Aquas J    06/08/21 0045 06/08/21 0041  predniSONE (DELTASONE) tablet 60 mg  Once        Route: Oral  Ordered Dose: 60 mg     Last MAR action: Given Chinelo Benn J               MDM    Recommend f/u with MRI. No evidence of acute cord injury.                Procedures    Clinical Impression & Disposition     Clinical Impression  Final diagnoses:   Rib pain   Radiculopathy, cervicothoracic region   Costochondritis, acute        ED Disposition       ED  Disposition   Discharge    Condition   --    Date/Time   Wed Jun 08, 2021  1:20 AM    Comment   Mickie Bail discharge to home/self care.    Condition at disposition: Stable                  Discharge Medication List as of 06/08/2021  1:20 AM        START taking these medications    Details   diazePAM (VALIUM) 5 MG tablet Take 1 tablet (5 mg total) by mouth every 6 (six) hours as needed for Sleep, Starting Wed 06/08/2021, E-Rx      oxyCODONE-acetaminophen (PERCOCET) 5-325 MG per tablet Take 1 tablet by mouth every 4 (four) hours as needed for Pain, Starting Wed 06/08/2021, Until Wed 06/15/2021 at 2359, E-Rx      predniSONE (DELTASONE) 20 MG tablet Take 3 tablets (60 mg total) by mouth daily, Starting Wed 06/08/2021, E-Rx                         Terance Ice, DO  06/09/21 0308

## 2021-06-10 ENCOUNTER — Telehealth (INDEPENDENT_AMBULATORY_CARE_PROVIDER_SITE_OTHER): Payer: Self-pay | Admitting: Family Medicine

## 2021-06-10 NOTE — Telephone Encounter (Signed)
Attempted to call patient for ED follow up visit. LMTCB.

## 2021-06-13 ENCOUNTER — Ambulatory Visit (INDEPENDENT_AMBULATORY_CARE_PROVIDER_SITE_OTHER): Payer: Commercial Managed Care - POS | Admitting: Physician Assistant

## 2021-06-13 ENCOUNTER — Encounter (INDEPENDENT_AMBULATORY_CARE_PROVIDER_SITE_OTHER): Payer: Self-pay | Admitting: Family Medicine

## 2021-07-01 NOTE — Progress Notes (Signed)
Subjective:      Date: 07/04/2021 5:03 PM   Patient ID: Maria Rowe is a 50 y.o. female.    Chief Complaint:  Chief Complaint   Patient presents with    Chest Pain     Patient was at the er for chest pain on the right side, along with neck pain back in sept, per patient she is no longer having the pain, her right index finger is numb x2weeks        HPI:  Pt presents for follow up after recent ED visit.  Pt was seen in the [facility] ED on 06/08/21.  She presented with complaints of right chest wall pain and neck pain.    She was treated with medications including Diazepam, Prednisone, and Percocet but ultimately didn't end up taking most of them after she left the hospital.    Ultimately she was advised to have outpatient follow up  Pt saw neurology in the days following that - had x-rays (diagnosed with DDD, cervical) and was referred to PT which she has already started and is having great benefit.        Problem List:  Patient Active Problem List   Diagnosis    Chest pain    Neck pain    DDD (degenerative disc disease), cervical       Current Medications:  No outpatient medications have been marked as taking for the 07/04/21 encounter (Office Visit) with Marisa Cyphers, DO.       Allergies:  No Known Allergies    Past Medical History:  History reviewed. No pertinent past medical history.    Past Surgical History:  Past Surgical History:   Procedure Laterality Date    CESAREAN SECTION      x3     TUBAL LIGATION      2010       Family History:  Family History   Problem Relation Age of Onset    Heart disease Mother         CHF    Heart disease Father     Cancer Maternal Aunt         breast    Cancer Maternal Grandmother     Cancer Paternal Grandmother        Social History:  Social History     Socioeconomic History    Marital status: Married   Tobacco Use    Smoking status: Never    Smokeless tobacco: Never   Vaping Use    Vaping Use: Never used   Substance and Sexual Activity    Alcohol use: Yes      Comment: occ    Drug use: No    Sexual activity: Yes        The following sections were reviewed this encounter by the provider:   Problems          Vitals:  BP 126/73 (BP Site: Left arm, Patient Position: Sitting, Cuff Size: Medium)    Pulse 64    Temp 98.1 F (36.7 C) (Temporal)    Resp 18    Wt 84.6 kg (186 lb 6.4 oz)    SpO2 97%    BMI 32.00 kg/m       ROS:  General ROS: negative for - chills, fatigue, fever  Respiratory ROS: negative for - cough, shortness of breath  Cardiovascular ROS: negative for - chest pain, palpitations   Gastrointestinal ROS: negative for - abdominal pain, constipation, diarrhea  Genito-Urinary ROS: negative  for - dysuria, hematuria or urinary frequency/urgency  Musculoskeletal ROS: positive for - neck pain; negative for muscular weakness  Neurological ROS: negative for - dizziness, headaches; positive for - numbness/tingling (right hand)  Dermatological ROS: negative for - dry skin, pruritus and rash      Objective:       Physical Exam:  General appearance - alert, well appearing, and in no distress  Neck - supple, no significant adenopathy, no cervical lymphadenopathy  Chest - clear to auscultation, no wheezes, rales or rhonchi, symmetric air entry  Heart - normal rate, regular rhythm, normal S1, S2, no murmurs, rubs, clicks or gallops  Back - full range of motion in all planes  Neurological - alert, oriented, normal speech, no focal findings or movement disorder noted  Musculoskeletal - no joint tenderness, deformity or swelling  Extremities - peripheral pulses normal, no pedal edema, no clubbing or cyanosis  Skin - normal coloration and turgor, no rashes, no suspicious skin lesions noted      Assessment/Plan:       1. DDD (degenerative disc disease), cervical  - Ambulatory referral to Chiropractic    2. Neck pain  - Ambulatory referral to Chiropractic    3. Rib pain  - Ambulatory referral to Chiropractic    -  Advised to continue with PT    Marisa Cyphers, DO

## 2021-07-04 ENCOUNTER — Ambulatory Visit (INDEPENDENT_AMBULATORY_CARE_PROVIDER_SITE_OTHER): Payer: Commercial Managed Care - POS | Admitting: Family Medicine

## 2021-07-04 ENCOUNTER — Encounter (INDEPENDENT_AMBULATORY_CARE_PROVIDER_SITE_OTHER): Payer: Self-pay | Admitting: Family Medicine

## 2021-07-04 VITALS — BP 126/73 | HR 64 | Temp 98.1°F | Resp 18 | Wt 186.4 lb

## 2021-07-04 DIAGNOSIS — R0781 Pleurodynia: Secondary | ICD-10-CM

## 2021-07-04 DIAGNOSIS — M5030 Other cervical disc degeneration, unspecified cervical region: Secondary | ICD-10-CM

## 2021-07-04 DIAGNOSIS — M542 Cervicalgia: Secondary | ICD-10-CM

## 2021-07-04 NOTE — Progress Notes (Signed)
Have you seen any specialists/other providers since your last visit with [place]?    Yes  Neuro   Arm preference verified?   Yes    The patient is due for mammogram, pap smear, and influenza vaccine

## 2022-01-20 ENCOUNTER — Ambulatory Visit (INDEPENDENT_AMBULATORY_CARE_PROVIDER_SITE_OTHER): Payer: Commercial Managed Care - POS | Admitting: Family Medicine

## 2022-02-14 NOTE — Progress Notes (Unsigned)
Subjective:      Date: 02/15/2022 3:58 PM   Patient ID: Maria Rowe is a 51 y.o. female.    Chief Complaint:  Chief Complaint   Patient presents with    Chest Pain     Pain has continued since the fall. Not as bad when she made the appointment. Not related to cardiac per patient.    Tick Removal     Tick bite on lower abdomen area ,three weeks ago, red and itchy       HPI:  Pt presents for follow up on chest wall pain.  This has been intermittent since her fall last September.  When she scheduled today's appointment, it was bothering her a lot more than it is now.  She thinks that it may just be stress at this point.      Pt also reports a tick bite.  This occurred about 3 weeks ago.  The tick was removed but the area has continued to be red and itchy.        Problem List:  Patient Active Problem List   Diagnosis    Neck pain    DDD (degenerative disc disease), cervical       Current Medications:  No outpatient medications have been marked as taking for the 02/15/22 encounter (Office Visit) with Marisa Cyphers, DO.       Allergies:  No Known Allergies    Past Medical History:  History reviewed. No pertinent past medical history.    Past Surgical History:  Past Surgical History:   Procedure Laterality Date    CESAREAN SECTION      x3     TUBAL LIGATION      2010       Family History:  Family History   Problem Relation Age of Onset    Heart disease Mother         CHF    Heart disease Father     Cancer Maternal Aunt         breast    Cancer Maternal Grandmother     Cancer Paternal Grandmother        Social History:  Social History     Socioeconomic History    Marital status: Married   Tobacco Use    Smoking status: Never    Smokeless tobacco: Never   Vaping Use    Vaping status: Never Used   Substance and Sexual Activity    Alcohol use: Yes     Comment: occ    Drug use: No    Sexual activity: Yes        The following sections were reviewed this encounter by the provider:   Problems         Vitals:  BP 102/68  (BP Site: Right arm, Patient Position: Sitting, Cuff Size: Large)   Pulse 74   Temp 98.3 F (36.8 C) (Temporal)   Wt 89.4 kg (197 lb)   SpO2 96%   BMI 33.81 kg/m       ROS:  General ROS: negative for - chills, fatigue, fever  Respiratory ROS: negative for - cough, shortness of breath  Cardiovascular ROS: negative for - chest pain, palpitations   Gastrointestinal ROS: negative for - abdominal pain, constipation, diarrhea  Genito-Urinary ROS: negative for - dysuria, hematuria or urinary frequency/urgency  Musculoskeletal ROS: negative for - muscle pain, joint pain  Neurological ROS: negative for - dizziness, headaches or numbness/tingling  Dermatological ROS: negative for - dry skin,  pruritus and rash; positive for - tick bite         Objective:       Physical Exam:  General appearance - alert, well appearing, and in no distress, oriented to person, place, and time and normal appearing weight  Chest - clear to auscultation, no wheezes, rales or rhonchi, symmetric air entry  Heart - normal rate, regular rhythm, normal S1, S2, no murmurs, rubs, clicks or gallops  Abdomen - soft, nontender, nondistended, no masses or organomegaly  Neurological - alert, oriented, normal speech, no focal findings or movement disorder noted  Musculoskeletal - no joint tenderness, deformity or swelling  Extremities - peripheral pulses normal, no pedal edema, no clubbing or cyanosis  Skin - normal coloration and turgor, no rashes; small area of localized redness around previous tick bite on right lateral lower abdomen         Assessment/Plan:       1. Chest wall pain    2. Tick bite of abdomen, initial encounter    -  Discussed conservative management with ice to affected area and topical hydrocortisone if itchy  -  Although in an endemic area for Lyme, likelihood of transmission is low given short duration of attachment  -  No need for Lyme testing at this time.  Return for any new rashes, fever, body aches.            Marisa Cyphers, DO

## 2022-02-15 ENCOUNTER — Ambulatory Visit (INDEPENDENT_AMBULATORY_CARE_PROVIDER_SITE_OTHER): Payer: Commercial Managed Care - POS | Admitting: Family Medicine

## 2022-02-15 ENCOUNTER — Encounter (INDEPENDENT_AMBULATORY_CARE_PROVIDER_SITE_OTHER): Payer: Self-pay | Admitting: Family Medicine

## 2022-02-15 VITALS — BP 102/68 | HR 74 | Temp 98.3°F | Wt 197.0 lb

## 2022-02-15 DIAGNOSIS — R0789 Other chest pain: Secondary | ICD-10-CM

## 2022-02-15 DIAGNOSIS — M5030 Other cervical disc degeneration, unspecified cervical region: Secondary | ICD-10-CM

## 2022-02-15 DIAGNOSIS — S30861A Insect bite (nonvenomous) of abdominal wall, initial encounter: Secondary | ICD-10-CM

## 2022-02-15 DIAGNOSIS — W57XXXA Bitten or stung by nonvenomous insect and other nonvenomous arthropods, initial encounter: Secondary | ICD-10-CM

## 2022-02-15 NOTE — Progress Notes (Signed)
Have you seen any specialists since your last visit with [place]?  Yes PT      The patient was informed that the following HM items are still outstanding:   mammogram, colonoscopy, pap smear, and Hep C screening

## 2022-03-02 ENCOUNTER — Encounter (INDEPENDENT_AMBULATORY_CARE_PROVIDER_SITE_OTHER): Payer: Self-pay | Admitting: Family Medicine

## 2022-03-06 ENCOUNTER — Encounter (INDEPENDENT_AMBULATORY_CARE_PROVIDER_SITE_OTHER): Payer: Self-pay | Admitting: Family Medicine

## 2022-03-28 ENCOUNTER — Ambulatory Visit: Payer: Self-pay

## 2022-03-28 ENCOUNTER — Ambulatory Visit (INDEPENDENT_AMBULATORY_CARE_PROVIDER_SITE_OTHER): Payer: Commercial Managed Care - POS | Admitting: Family Medicine

## 2022-03-28 ENCOUNTER — Encounter (INDEPENDENT_AMBULATORY_CARE_PROVIDER_SITE_OTHER): Payer: Self-pay | Admitting: Family Medicine

## 2022-03-28 VITALS — BP 111/74 | HR 72 | Temp 97.4°F | Resp 17 | Ht 64.41 in | Wt 203.0 lb

## 2022-03-28 DIAGNOSIS — Z1231 Encounter for screening mammogram for malignant neoplasm of breast: Secondary | ICD-10-CM

## 2022-03-28 DIAGNOSIS — E6609 Other obesity due to excess calories: Secondary | ICD-10-CM

## 2022-03-28 DIAGNOSIS — Z124 Encounter for screening for malignant neoplasm of cervix: Secondary | ICD-10-CM

## 2022-03-28 DIAGNOSIS — Z1211 Encounter for screening for malignant neoplasm of colon: Secondary | ICD-10-CM

## 2022-03-28 DIAGNOSIS — Z0000 Encounter for general adult medical examination without abnormal findings: Secondary | ICD-10-CM

## 2022-03-28 DIAGNOSIS — Z6834 Body mass index (BMI) 34.0-34.9, adult: Secondary | ICD-10-CM

## 2022-03-28 DIAGNOSIS — Z1159 Encounter for screening for other viral diseases: Secondary | ICD-10-CM

## 2022-03-28 LAB — CBC AND DIFFERENTIAL
Absolute NRBC: 0 10*3/uL (ref 0.00–0.00)
Basophils Absolute Automated: 0.03 10*3/uL (ref 0.00–0.08)
Basophils Automated: 0.5 %
Eosinophils Absolute Automated: 0.07 10*3/uL (ref 0.00–0.44)
Eosinophils Automated: 1.2 %
Hematocrit: 42 % (ref 34.7–43.7)
Hgb: 14.9 g/dL — ABNORMAL HIGH (ref 11.4–14.8)
Immature Granulocytes Absolute: 0.01 10*3/uL (ref 0.00–0.07)
Immature Granulocytes: 0.2 %
Instrument Absolute Neutrophil Count: 3.67 10*3/uL (ref 1.10–6.33)
Lymphocytes Absolute Automated: 1.87 10*3/uL (ref 0.42–3.22)
Lymphocytes Automated: 31 %
MCH: 32.1 pg (ref 25.1–33.5)
MCHC: 35.5 g/dL (ref 31.5–35.8)
MCV: 90.5 fL (ref 78.0–96.0)
MPV: 9 fL (ref 8.9–12.5)
Monocytes Absolute Automated: 0.38 10*3/uL (ref 0.21–0.85)
Monocytes: 6.3 %
Neutrophils Absolute: 3.67 10*3/uL (ref 1.10–6.33)
Neutrophils: 60.8 %
Nucleated RBC: 0 /100 WBC (ref 0.0–0.0)
Platelets: 327 10*3/uL (ref 142–346)
RBC: 4.64 10*6/uL (ref 3.90–5.10)
RDW: 13 % (ref 11–15)
WBC: 6.03 10*3/uL (ref 3.10–9.50)

## 2022-03-28 LAB — HEMOGLOBIN A1C
Average Estimated Glucose: 102.5 mg/dL
Hemoglobin A1C: 5.2 % (ref 4.6–5.6)

## 2022-03-28 NOTE — Progress Notes (Signed)
Date: 03/28/2022 11:58 AM   Patient ID: Maria Rowe is a 52 y.o. female.         Have you seen any specialists since your last visit with [place]? 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  Reported Health: good health  Reported Diet: moderate compliance with well-balanced diet  Reported Exercise: daily, 15-30 minutes/day, and walking  Dental: regular dental visits twice a year  Vision: glasses and eye exam < 1 year ago  Hearing: normal hearing  Immunization Status: immunizations up to date  Reproductive Health: sexually active, married; 3 daughters ages 46, 76, and 3  Prior Screening Tests: no previous colorectal cancer screening, pap smear > 5 years ago, and mammogram > 2 years ago  General Health Risks: no family history of prostate cancer, no family history of colon cancer, and no family history of breast cancer  Safety Elements Used: uses seat belts, smoke detectors in household, carbon monoxide detectors in household, sunscreen use, and does not text and drive    Problem List:  Patient Active Problem List   Diagnosis    Neck pain    DDD (degenerative disc disease), cervical       Current Medications:  No outpatient medications have been marked as taking for the 03/28/22 encounter (Office Visit) with Marisa Cyphers, DO.        Allergies:  No Known Allergies    Past Medical History:  History reviewed. No pertinent past medical history.    Past Surgical History:  Past Surgical History:   Procedure Laterality Date    CESAREAN SECTION      x3     TUBAL LIGATION      2010       Family History:  Family History   Problem Relation Age of Onset    Heart disease Mother         CHF    Heart disease Father     Cancer Maternal  Aunt         breast    Cancer Paternal Aunt         Breast    Cancer Maternal Grandmother     Cancer Paternal Grandmother        Social History:  Social History     Tobacco Use    Smoking status: Never    Smokeless tobacco: Never   Vaping Use    Vaping Use: Never used   Substance Use Topics    Alcohol use: Yes     Comment: occ    Drug use: No        The following sections were reviewed this encounter by the provider:   Problems         Vitals:  BP 111/74 (BP Site: Left arm, Patient Position: Sitting, Cuff Size: Large)   Pulse 72   Temp 97.4 F (36.3 C) (Temporal)   Resp 17   Ht 1.636 m (5' 4.41")   Wt 92.1 kg (203 lb)   SpO2 96%   BMI 34.40 kg/m       ROS:  General ROS: negative for -  chills, fatigue, fever, hot flashes, malaise, night sweats, sleep disturbance; positive for - weight gain (less exercise)  Psychological ROS: negative for - anxiety, concentration difficulties, depression, hallucinations or memory difficulties  Ophthalmic ROS: negative for - blurry vision, double vision, eye pain or photophobia  ENT ROS: negative for - hearing change, nasal congestion, nasal discharge, sinus pain, sore throat, tinnitus or vertigo  Allergy and Immunology ROS: negative for - hives, itchy/watery eyes, nasal congestion, postnasal drip or seasonal allergies  Hematological and Lymphatic ROS: negative for - bleeding problems, bruising, fatigue, night sweats, swollen lymph nodes or weight loss  Endocrine ROS: negative for - hot flashes, polydipsia/polyuria, temperature intolerance or unexpected weight changes  Breast ROS: negative for - new or changing breast lumps or nipple discharge  Respiratory ROS: negative for - cough, orthopnea, shortness of breath, sputum changes or wheezing  Cardiovascular ROS: negative for - chest pain, edema, irregular heartbeat, palpitations or shortness of breath  Gastrointestinal ROS: negative for - abdominal pain, appetite loss, blood in stools, constipation, diarrhea, gas/bloating,  heartburn or nausea/vomiting  Genito-Urinary ROS: negative for - dysuria, hematuria or urinary frequency/urgency; positive for - amenorrhea (s/p uterine ablation)  Musculoskeletal ROS: negative for - joint pain, joint swelling, muscle pain or muscular weakness  Neurological ROS: negative for - dizziness, headaches or numbness/tingling  Dermatological ROS: negative for - dry skin, pruritus and rash      Objective:       Physical Exam:  General appearance - alert, well appearing, and in no distress, oriented to person, place, and time and normal appearing weight  Mental status - alert, oriented to person, place, and time, normal mood, appropriate behavior, speech, dress, motor activity, and thought processes  Eyes - pupils equal and reactive, extraocular eye movements intact  Ears - bilateral TM's and external ear canals normal  Nose - normal and patent, no erythema, discharge or polyps and normal nontender sinuses  Mouth - mucous membranes moist, pharynx normal without lesions  Neck - supple, no significant adenopathy, no cervical lymphadenopathy  Breast - breasts appear normal, no suspicious masses, no skin or nipple changes or axillary nodes  Chest - clear to auscultation, no wheezes, rales or rhonchi, symmetric air entry  Heart - normal rate, regular rhythm, normal S1, S2, no murmurs, rubs, clicks or gallops  Abdomen - soft, nontender, nondistended, no masses or organomegaly  Pelvic Exam - normal external genitalia, vulva, vagina, cervix, uterus and adnexa, VULVA: normal appearing vulva with no masses, tenderness or lesions, VAGINA: normal appearing vagina with normal color and discharge, no lesions, CERVIX: normal appearing cervix without discharge or lesions, UTERUS: uterus is normal size, shape, consistency and nontender, ADNEXA: normal adnexa in size, nontender and no masses  Neurological - alert, oriented, normal speech, no focal findings or movement disorder noted  Musculoskeletal - no joint tenderness,  deformity or swelling  Extremities - peripheral pulses normal, no pedal edema, no clubbing or cyanosis  Skin - normal coloration and turgor, no rashes, no suspicious skin lesions noted      Assessment/Plan:       1. Well adult exam  - CBC and differential  - Comprehensive metabolic panel  - TSH  - Lipid panel  - Hemoglobin A1C    2. Need for hepatitis C screening test  - Hepatitis C (HCV) antibody, Total    3. Screening for cervical cancer  - Thin Prep PAP w/Imaging & HPV DNA(high risk w/16/18    4. Encounter for screening mammogram for malignant  neoplasm of breast  - Mammo Digital 2D Screening Bilateral W CAD; Future    5. Screening for colon cancer  - Cologuard    6. Class 1 obesity due to excess calories without serious comorbidity with body mass index (BMI) of 34.0 to 34.9 in adult          Marisa Cyphers, DO

## 2022-03-29 LAB — LIPID PANEL
Cholesterol / HDL Ratio: 3.2 Index
Cholesterol: 218 mg/dL — ABNORMAL HIGH (ref 0–199)
HDL: 68 mg/dL (ref 40–9999)
LDL Calculated: 128 mg/dL — ABNORMAL HIGH (ref 0–99)
Triglycerides: 110 mg/dL (ref 34–149)
VLDL Calculated: 22 mg/dL (ref 10–40)

## 2022-03-29 LAB — THIN PREP-PAP W/IMAGING & HPV DNA (HIGH RISK W/16/18)
HPV Genotype, 16: NEGATIVE
HPV Genotype, 18: NEGATIVE
HPV High Risk, Other: NEGATIVE

## 2022-03-29 LAB — COMPREHENSIVE METABOLIC PANEL
ALT: 17 U/L (ref 0–55)
AST (SGOT): 17 U/L (ref 5–41)
Albumin/Globulin Ratio: 1.5 (ref 0.9–2.2)
Albumin: 4.5 g/dL (ref 3.5–5.0)
Alkaline Phosphatase: 59 U/L (ref 37–117)
Anion Gap: 12 (ref 5.0–15.0)
BUN: 17 mg/dL (ref 7.0–21.0)
Bilirubin, Total: 0.7 mg/dL (ref 0.2–1.2)
CO2: 26 mEq/L (ref 17–29)
Calcium: 9.3 mg/dL (ref 8.5–10.5)
Chloride: 102 mEq/L (ref 99–111)
Creatinine: 0.8 mg/dL (ref 0.4–1.0)
Globulin: 3 g/dL (ref 2.0–3.6)
Glucose: 65 mg/dL — ABNORMAL LOW (ref 70–100)
Potassium: 4.1 mEq/L (ref 3.5–5.3)
Protein, Total: 7.5 g/dL (ref 6.0–8.3)
Sodium: 140 mEq/L (ref 135–145)
eGFR: >60 mL/min/{1.73_m2} (ref >=60)

## 2022-03-29 LAB — HEMOLYSIS INDEX: Hemolysis Index: 24 Index (ref 0–24)

## 2022-03-29 LAB — TSH: TSH: 0.84 u[IU]/mL (ref 0.35–4.94)

## 2022-03-29 LAB — HEPATITIS C ANTIBODY: Hepatitis C, AB: NONREACTIVE

## 2022-04-03 LAB — LAB USE ONLY - HISTORICAL GYN CYTOLOGY/PAP SMEAR

## 2022-04-07 LAB — COLOGUARD: Cologuard Result: NEGATIVE

## 2022-04-14 ENCOUNTER — Other Ambulatory Visit (INDEPENDENT_AMBULATORY_CARE_PROVIDER_SITE_OTHER): Payer: Self-pay | Admitting: Family Medicine

## 2022-04-14 DIAGNOSIS — Z1231 Encounter for screening mammogram for malignant neoplasm of breast: Secondary | ICD-10-CM

## 2022-05-24 ENCOUNTER — Other Ambulatory Visit (INDEPENDENT_AMBULATORY_CARE_PROVIDER_SITE_OTHER): Payer: Self-pay | Admitting: Family Medicine

## 2022-08-31 ENCOUNTER — Encounter (INDEPENDENT_AMBULATORY_CARE_PROVIDER_SITE_OTHER): Payer: Self-pay | Admitting: Family

## 2022-08-31 ENCOUNTER — Ambulatory Visit (INDEPENDENT_AMBULATORY_CARE_PROVIDER_SITE_OTHER): Payer: Commercial Managed Care - POS | Admitting: Family

## 2022-08-31 VITALS — BP 119/72 | HR 90 | Temp 98.1°F | Resp 18 | Wt 215.0 lb

## 2022-08-31 DIAGNOSIS — R051 Acute cough: Secondary | ICD-10-CM

## 2022-08-31 DIAGNOSIS — J029 Acute pharyngitis, unspecified: Secondary | ICD-10-CM

## 2022-08-31 DIAGNOSIS — J069 Acute upper respiratory infection, unspecified: Secondary | ICD-10-CM

## 2022-08-31 LAB — IHS AMB POCT SOFIA (TM) COVID-19 & FLU A/B
Sofia Influenza A Ag POCT: NEGATIVE
Sofia Influenza B Ag POCT: NEGATIVE
Sofia SARS COV2 Antigen POCT: NEGATIVE

## 2022-08-31 LAB — POCT RAPID STREP A: Rapid Strep A Screen POCT: NEGATIVE

## 2022-08-31 NOTE — Progress Notes (Signed)
[facility]    PROGRESS NOTE      Patient: Maria Rowe   Date: 08/31/2022   MRN: [id number]     History reviewed. No pertinent past medical history.  Social History     Socioeconomic History    Marital status: Married   Tobacco Use    Smoking status: Never    Smokeless tobacco: Never   Vaping Use    Vaping Use: Never used   Substance and Sexual Activity    Alcohol use: Yes     Comment: occ    Drug use: No    Sexual activity: Yes     Social Determinants of Health     Financial Resource Strain: Low Risk  (03/25/2022)    Overall Financial Resource Strain (CARDIA)     Difficulty of Paying Living Expenses: Not hard at all   Food Insecurity: No Food Insecurity (03/25/2022)    Hunger Vital Sign     Worried About Running Out of Food in the Last Year: Never true     Ran Out of Food in the Last Year: Never true   Transportation Needs: No Transportation Needs (03/25/2022)    PRAPARE - Therapist, art (Medical): No     Lack of Transportation (Non-Medical): No   Physical Activity: Insufficiently Active (03/25/2022)    Exercise Vital Sign     Days of Exercise per Week: 3 days     Minutes of Exercise per Session: 20 min   Social Connections: Socially Integrated (03/25/2022)    Social Connection and Isolation Panel [NHANES]     Frequency of Communication with Friends and Family: More than three times a week     Frequency of Social Gatherings with Friends and Family: Once a week     Attends Religious Services: More than 4 times per year     Active Member of [employer] or Organizations: Yes     Attends Engineer, structural: More than 4 times per year     Marital Status: Married   Catering manager Violence: Not At Risk (03/25/2022)    Humiliation, Afraid, Rape, and Kick questionnaire     Fear of Current or Ex-Partner: No     Emotionally Abused: No     Physically Abused: No     Sexually Abused: No   Housing Stability: Low Risk  (03/25/2022)    Housing Stability Vital Sign     Unable to Pay for  Housing in the Last Year: No     Number of Places Lived in the Last Year: 1     Unstable Housing in the Last Year: No     Family History   Problem Relation Age of Onset    Heart disease Mother         CHF    Heart disease Father     Cancer Maternal Aunt         breast    Cancer Paternal Aunt         Breast    Cancer Maternal Grandmother     Cancer Paternal Grandmother        ASSESSMENT/PLAN     Maria Rowe is a 51 y.o. female    Chief Complaint   Patient presents with    Cough        1. Acute cough  - Sofia(TM) SARS COVID19 & Flu A/B POCT  - COVID-19 (SARS-CoV-2)    2. Sore throat  -  Throat Culture  - POCT Rapid Group A Strep    3. Viral URI    Rapid strep is negative, will send culture and treat with antibiotics if indicated.  Rapid influenza is negative for Influenza A/B, reviewed result with pt during office visit.   Rapid Covid-19 negative, PCR sent    Continue with over the counter medications that relieve your symptoms.   Delsym can help with dry cough.  Flonase to help with post nasal drip and sinus pressure.     Tylenol and Ibuprofen will help with fever and discomfort.  Salt water gargles, lozenges, and warm beverages can help to relieve a sore throat.  Saline nasal spray or irrigation can help clear nasal congestion.  Vaporizers and cool mist humidifiers can help to provide relief.  Wash your hands frequently.  Contact your PCP, the ER or return to be seen if you develop a high fever or have difficulty breathing.  Your symptoms are most likely caused by a virus.  Antibiotics do not treat viruses and will not help you feel better.  You will get the most relief by treating the symptoms that bother you the most. Get as much rest as possible.    Ddx: viral URI, pharyngitis, bronchitis, sinusitis, pneumonia, allergic rhinitis, RAD, PND, influenza, COVID-19       Recent Results (from the past 24 hour(s))   POCT Rapid Group A Strep    Collection Time: 08/31/22  8:10 AM   Result Value Ref Range    POCT QC  Pass     Rapid Strep A Screen POCT Negative Negative    Comment       Negative Results should be confirmed by throat Cx to confirm absence of Strep A inf.   Sofia(TM) SARS COVID19 & Flu A/B POCT    Collection Time: 08/31/22  8:10 AM   Result Value Ref Range    Sofia SARS COV2 Antigen POCT Negative Negative    Sofia Influenza A Ag POCT Negative Negative    Sofia Influenza B Ag POCT Negative Negative         Risk & Benefits of the new medication(s) were explained to the patient (and family) who verbalized understanding & agreed to the treatment plan. Patient (family) encouraged to contact me/clinical staff with any questions/concerns      MEDICATIONS     No outpatient medications have been marked as taking for the 08/31/22 encounter (Office Visit) with Morene Crocker, FNP.         No Known Allergies    SUBJECTIVE     Chief Complaint   Patient presents with    Cough        Cough  This is a new problem. Episode onset: Monday -  3 days ago. The problem has been gradually worsening. The cough is Non-productive. Associated symptoms include ear congestion, nasal congestion, postnasal drip, rhinorrhea and a sore throat. Pertinent negatives include no chills, ear pain, fever, headaches, hemoptysis, myalgias, rash, shortness of breath, sweats or wheezing. Associated symptoms comments: Chest congestion , Runny nose . Nothing aggravates the symptoms. Treatments tried: Dayquil , Nyquil, Sudafed. The treatment provided moderate relief. Her past medical history is significant for bronchitis. There is no history of asthma or pneumonia.     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 Negative for appetite  change, chills and fever.   HENT:  Positive for congestion, postnasal drip, rhinorrhea and sore throat. Negative for ear pain.    Respiratory:  Positive for cough (mostly dry) and chest tightness. Negative for hemoptysis, shortness of  breath and wheezing.    Gastrointestinal:  Negative for diarrhea, nausea and vomiting.   Musculoskeletal:  Negative for myalgias.   Skin:  Negative for rash.   Neurological:  Negative for headaches.        The following sections were reviewed this encounter by the provider:   Tobacco  Allergies  Meds  Problems  Med Hx  Surg Hx  Fam Hx         PHYSICAL EXAM     Vitals:    08/31/22 0812   BP: 119/72   BP Site: Left arm   Patient Position: Sitting   Cuff Size: Large   Pulse: 90   Resp: 18   Temp: 98.1 F (36.7 C)   TempSrc: Temporal   SpO2: 97%   Weight: 97.5 kg (215 lb)       Physical Exam  Vitals and nursing note reviewed.   Constitutional:       General: She is not in acute distress.     Appearance: She is well-developed. She is not diaphoretic.   HENT:      Head: Normocephalic and atraumatic.      Right Ear: Ear canal and external ear normal. A middle ear effusion (clear) is present.      Left Ear: Ear canal and external ear normal. A middle ear effusion (clear) is present.      Nose: Congestion present.      Right Sinus: No maxillary sinus tenderness or frontal sinus tenderness.      Left Sinus: No maxillary sinus tenderness or frontal sinus tenderness.      Comments: +pressure over maxillary sinuses     Mouth/Throat:      Lips: Pink.      Mouth: Mucous membranes are moist.      Pharynx: Uvula midline. Pharyngeal swelling (mild) and posterior oropharyngeal erythema present. No oropharyngeal exudate or uvula swelling.   Eyes:      Pupils: Pupils are equal, round, and reactive to light.   Cardiovascular:      Rate and Rhythm: Normal rate and regular rhythm.      Heart sounds: Normal heart sounds. No murmur heard.     No friction rub. No gallop.   Pulmonary:      Effort: Pulmonary effort is normal. No respiratory distress.      Breath sounds: Normal breath sounds. No decreased breath sounds, wheezing, rhonchi or rales.   Musculoskeletal:      Cervical back: Normal range of motion and neck supple.    Lymphadenopathy:      Cervical: No cervical adenopathy.   Skin:     General: Skin is warm and dry.   Neurological:      Mental Status: She is alert and oriented to person, place, and time.   Psychiatric:         Behavior: Behavior normal.         Thought Content: Thought content normal.         Judgment: Judgment normal.       Ortho Exam  Neurologic Exam     Mental Status   Oriented to person, place, and time.     Cranial Nerves     CN III, IV, VI   Pupils are equal,  round, and reactive to light.      PROCEDURE(S)     Procedures        Signed,  Morene Crocker, FNP  08/31/2022

## 2022-08-31 NOTE — Patient Instructions (Signed)
Viral Syndrome (Adult)  A viral illness may cause many symptoms such as fever. Other symptoms depend on the part of the body that the virus affects. If it settles in your nose, throat, and lungs, it may cause cough, sore throat, congestion, runny nose, headache, earache and other ear symptoms, or shortness of breath. If it settles in your stomach and intestinal tract, it may cause nausea, vomiting, cramping, and diarrhea. Sometimes it causes generalized symptoms like aching all over, feeling tired, loss of energy, or loss of appetite.   A viral illness often lasts anywhere from a few days to a few weeks. But sometimes it lasts longer. In some cases, a more serious infection can look like a viral syndrome in the first few days of the illness. You may need another exam and additional tests to know the difference. Watch for the warning signs listed below for when to get medical advice.   Home care  Follow these guidelines for taking care of yourself at home:  If symptoms are severe, rest at home for the first 2 to 3 days.  Stay away from cigarette smoke - both your smoke and the smoke from others.  You may use over-the-counter acetaminophen or ibuprofen for fever, muscle aching, and headache, unless another medicine was prescribed for this. Antibiotics aren't used to treat viral infections. If you have chronic liver or kidney disease or ever had a stomach ulcer or gastrointestinal bleeding, talk with your healthcare provider before using these medicines. No one who is younger than 18 and ill with a fever should take aspirin. It may cause severe disease or death.  Your appetite may be poor, so a light diet is fine. Prevent dehydration by drinking 8 to 12, 8-ounce glasses of fluids each day. This may include water; orange juice; lemonade; apple, grape, and cranberry juice; clear fruit drinks; electrolyte replacement and sports drinks; and decaffeinated teas and coffee. If you've been diagnosed with a kidney disease,  ask your healthcare provider how much and what types of fluids you should drink to prevent dehydration. If you have kidney disease, drinking too much fluid can cause it build up in your body and be dangerous to your health.  Over-the-counter remedies won't shorten the length of the illness. But they may be helpful for symptoms such as cough, sore throat, nasal and sinus congestion, or diarrhea. Don't use decongestants if you have high blood pressure.  Follow-up care  Follow up with your healthcare provider if you don't get better over the next week.   Call 911  Call 911 if any of these occur:   Convulsion  Feeling weak, dizzy, or like you are going to faint  Chest pain, or more than mild shortness of breath  When to get medical advice  Call your healthcare provider right away if any of these occur:  Cough with lots of colored sputum (mucus) or blood in your sputum  Chest pain, shortness of breath, wheezing, or trouble breathing  Severe headache; face, neck, or ear pain  Severe, constant pain in the lower right side of your belly (abdominal)  Continued vomiting (can't keep liquids down)  Frequent diarrhea (more than 5 times a day), or blood (red or black color) or mucus in diarrhea  Feeling weak, dizzy, or like you are going to faint  Extreme thirst  Fever of 100.4F (38C) or higher, or as directed by your provider  [employer] last reviewed this educational content on 09/18/2020   2000-2023 [employer]   All rights reserved. This information is not intended as a substitute for professional medical care. Always follow your healthcare professional's instructions.

## 2022-09-01 LAB — SARS-COV-2 (COVID-19) RNA, PCR: SARS-CoV-2 Overall Result: NOT DETECTED

## 2023-05-22 NOTE — Progress Notes (Unsigned)
Subjective:      Date: 05/23/2023 9:59 AM   Patient ID: Maria Rowe is a 52 y.o. female.    Chief Complaint:  Chief Complaint   Patient presents with    Vaginal Bleeding     Started on 05/20/23 and lasted 24 hours, with cramping       HPI:  Pt presents with complaints of vaginal bleeding that started on 05/20/23 and lasted about 24 hours.  This is the first vaginal bleeding she has had since her uterine ablation in 2018.        Problem List:  Problem List[1]    Current Medications:  Medications Taking[2]    Allergies:  Allergies[3]    Past Medical History:  Medical History[4]    Past Surgical History:  Past Surgical History:   Procedure Laterality Date    CESAREAN SECTION      x3     TUBAL LIGATION      2010       Family History:  Family History   Problem Relation Age of Onset    Heart disease Mother         CHF    Heart disease Father     Cancer Maternal Aunt         breast    Cancer Paternal Aunt         Breast    Cancer Maternal Grandmother     Cancer Paternal Grandmother        Social History:  Social History[5]     The following sections were reviewed this encounter by the provider:   Problems         Vitals:  Pulse 74   Temp 98.4 F (36.9 C) (Temporal)   Resp 12   Wt 94.9 kg (209 lb 3.2 oz)   SpO2 95%   BMI 35.45 kg/m       ROS:  General ROS: negative for - chills, fatigue, fever  Respiratory ROS: negative for - cough, orthopnea, shortness of breath  Cardiovascular ROS: negative for - chest pain, palpitations   Gastrointestinal ROS: negative for - abdominal pain, appetite loss, blood in stools, constipation, diarrhea, gas/bloating, heartburn or nausea/vomiting  Genito-Urinary ROS: negative for - dysuria, hematuria, urinary frequency/urgency, vaginal discharge, itching  Dermatological ROS: negative for - dry skin, pruritus and rash      Objective:       Physical Exam:  General appearance - alert, well appearing, and in no distress  Chest - clear to auscultation, no wheezes, rales or rhonchi, symmetric  air entry  Heart - normal rate, regular rhythm, normal S1, S2, no murmurs, rubs, clicks or gallops  Abdomen - soft, nontender, nondistended, no masses or organomegaly  Skin - normal coloration and turgor, no rashes, no suspicious skin lesions noted      Assessment/Plan:       1. Vagina bleeding  - Follicle Stimulating Hormone; Future  - Luteinizing Hormone; Future  - Testosterone; Future  - Progesterone; Future  - Estradiol; Future  - US Pelvis with Transvaginal; Future    2. Well adult exam  - Follow Up In Primary Care; Future        Marisa Cyphers, DO           [1]   Patient Active Problem List  Diagnosis    Neck pain    DDD (degenerative disc disease), cervical   [2]   No outpatient medications have been marked as taking for the 05/23/23 encounter (  Office Visit) with Marisa Cyphers, DO.   [3] No Known Allergies  [4] History reviewed. No pertinent past medical history.  [5]   Social History  Socioeconomic History    Marital status: Married   Tobacco Use    Smoking status: Never    Smokeless tobacco: Never   Vaping Use    Vaping status: Never Used   Substance and Sexual Activity    Alcohol use: Yes     Comment: occ    Drug use: No    Sexual activity: Yes     Social Determinants of Health     Financial Resource Strain: Low Risk  (05/21/2023)    Overall Financial Resource Strain (CARDIA)     Difficulty of Paying Living Expenses: Not hard at all   Food Insecurity: No Food Insecurity (05/21/2023)    Hunger Vital Sign     Worried About Running Out of Food in the Last Year: Never true     Ran Out of Food in the Last Year: Never true   Transportation Needs: No Transportation Needs (05/21/2023)    PRAPARE - Therapist, art (Medical): No     Lack of Transportation (Non-Medical): No   Physical Activity: Insufficiently Active (05/21/2023)    Exercise Vital Sign     Days of Exercise per Week: 3 days     Minutes of Exercise per Session: 40 min   Stress: No Stress Concern Present (05/21/2023)    [employer] of Occupational Health - Occupational Stress Questionnaire     Feeling of Stress : Only a little   Social Connections: Moderately Integrated (05/21/2023)    Social Connection and Isolation Panel [NHANES]     Frequency of Communication with Friends and Family: Once a week     Frequency of Social Gatherings with Friends and Family: Once a week     Attends Religious Services: More than 4 times per year     Active Member of [employer] or Organizations: Yes     Attends Banker Meetings: More than 4 times per year     Marital Status: Married   Catering manager Violence: Not At Risk (05/21/2023)    Humiliation, Afraid, Rape, and Kick questionnaire     Fear of Current or Ex-Partner: No     Emotionally Abused: No     Physically Abused: No     Sexually Abused: No   Housing Stability: Low Risk  (05/21/2023)    Housing Stability Vital Sign     Unable to Pay for Housing in the Last Year: No     Number of Times Moved in the Last Year: 0     Homeless in the Last Year: No

## 2023-05-23 ENCOUNTER — Ambulatory Visit (INDEPENDENT_AMBULATORY_CARE_PROVIDER_SITE_OTHER): Payer: Commercial Managed Care - POS | Admitting: Family Medicine

## 2023-05-23 ENCOUNTER — Encounter (INDEPENDENT_AMBULATORY_CARE_PROVIDER_SITE_OTHER): Payer: Self-pay | Admitting: Family Medicine

## 2023-05-23 VITALS — HR 74 | Temp 98.4°F | Resp 12 | Wt 209.2 lb

## 2023-05-23 DIAGNOSIS — N939 Abnormal uterine and vaginal bleeding, unspecified: Secondary | ICD-10-CM

## 2023-05-23 DIAGNOSIS — Z0000 Encounter for general adult medical examination without abnormal findings: Secondary | ICD-10-CM

## 2023-05-23 LAB — FOLLICLE STIMULATING HORMONE: Follicle Stimulating Hormone: 44.07 m[IU]/mL

## 2023-05-23 LAB — ESTRADIOL: Estradiol: <24 pg/mL

## 2023-05-23 LAB — TESTOSTERONE: Testosterone: 34 ng/dL (ref 14–76)

## 2023-05-23 LAB — LUTEINIZING HORMONE: LH: 14.8 m[IU]/mL

## 2023-05-23 LAB — PROGESTERONE: Progesterone: <0.5 ng/mL

## 2023-05-23 NOTE — Progress Notes (Signed)
Have you seen any specialists since your last visit with [place]?  No      The patient was informed that the following HM items are still outstanding:   influenza vaccine, shingles vaccine, and annual exam, advanced directive

## 2023-05-30 ENCOUNTER — Ambulatory Visit
Admission: RE | Admit: 2023-05-30 | Discharge: 2023-05-30 | Disposition: A | Discharge status: Home / Self Care | Payer: Commercial Managed Care - POS | Source: Ambulatory Visit | Attending: Family Medicine | Admitting: Family Medicine

## 2023-05-30 DIAGNOSIS — N939 Abnormal uterine and vaginal bleeding, unspecified: Secondary | ICD-10-CM | POA: Insufficient documentation

## 2023-06-08 ENCOUNTER — Encounter (INDEPENDENT_AMBULATORY_CARE_PROVIDER_SITE_OTHER): Payer: Self-pay | Admitting: Family Medicine

## 2023-06-08 DIAGNOSIS — Z1231 Encounter for screening mammogram for malignant neoplasm of breast: Secondary | ICD-10-CM

## 2023-06-11 ENCOUNTER — Other Ambulatory Visit (INDEPENDENT_AMBULATORY_CARE_PROVIDER_SITE_OTHER): Payer: Self-pay | Admitting: Family Medicine

## 2023-06-11 DIAGNOSIS — Z1231 Encounter for screening mammogram for malignant neoplasm of breast: Secondary | ICD-10-CM

## 2023-06-13 ENCOUNTER — Other Ambulatory Visit (INDEPENDENT_AMBULATORY_CARE_PROVIDER_SITE_OTHER): Payer: Self-pay | Admitting: Family Medicine

## 2023-06-18 ENCOUNTER — Other Ambulatory Visit (INDEPENDENT_AMBULATORY_CARE_PROVIDER_SITE_OTHER): Payer: Self-pay | Admitting: Family Medicine

## 2023-06-18 DIAGNOSIS — Z1231 Encounter for screening mammogram for malignant neoplasm of breast: Secondary | ICD-10-CM

## 2023-06-19 ENCOUNTER — Emergency Department
Admission: EM | Admit: 2023-06-19 | Discharge: 2023-06-19 | Disposition: A | Discharge status: Home / Self Care | Payer: Commercial Managed Care - POS | Attending: Emergency Medicine | Admitting: Emergency Medicine

## 2023-06-19 ENCOUNTER — Encounter (INDEPENDENT_AMBULATORY_CARE_PROVIDER_SITE_OTHER): Payer: Self-pay | Admitting: Family Medicine

## 2023-06-19 DIAGNOSIS — L089 Local infection of the skin and subcutaneous tissue, unspecified: Secondary | ICD-10-CM | POA: Insufficient documentation

## 2023-06-19 DIAGNOSIS — L729 Follicular cyst of the skin and subcutaneous tissue, unspecified: Secondary | ICD-10-CM | POA: Insufficient documentation

## 2023-06-19 HISTORY — DX: Solitary cyst of left breast: N60.02

## 2023-06-19 MED ORDER — IBUPROFEN 600 MG PO TABS
600.0000 mg | ORAL_TABLET | Freq: Once | ORAL | Status: AC
Start: 2023-06-19 — End: 2023-06-19
  Administered 2023-06-19: 600 mg via ORAL
  Filled 2023-06-19: qty 1

## 2023-06-19 MED ORDER — LIDOCAINE HCL (PF) 1 % IJ SOLN
5.0000 mL | Freq: Once | INTRAMUSCULAR | Status: AC
Start: 2023-06-19 — End: 2023-06-19
  Administered 2023-06-19: 5 mL via SUBCUTANEOUS
  Filled 2023-06-19: qty 5

## 2023-06-19 MED ORDER — HYDROCODONE-ACETAMINOPHEN 5-325 MG PO TABS
1.0000 | ORAL_TABLET | Freq: Once | ORAL | Status: AC
Start: 2023-06-19 — End: 2023-06-19
  Administered 2023-06-19: 1 via ORAL
  Filled 2023-06-19 (×2): qty 1

## 2023-06-19 MED ORDER — CEPHALEXIN 500 MG PO CAPS
500.0000 mg | ORAL_CAPSULE | Freq: Three times a day (TID) | ORAL | 0 refills | Status: AC
Start: 2023-06-19 — End: 2023-06-26

## 2023-06-19 NOTE — ED Provider Notes (Signed)
EMERGENCY DEPARTMENT HISTORY AND PHYSICAL EXAM          Date Time: 06/23/23 6:29 PM  Patient Name: Maria Rowe  Mid level Provider: Edwyna Ready Livio Ledwith, PA-C    History of Presenting Illness:     Chief Complaint: cyst  History obtained from: Patient.    Narrative/Additional Historical Findings:Maria Rowe is Rowe 52 y.o. female who p/w painful cyst to chest wall for one week but worse in the last few days with redness. No exacerbating or alleviating factors. No other associated symptoms. Denies any f/c, drainage from the cyst. Reports hx of same that was drained. Pt states she had Rowe normal mammogram on Friday.       Nursing notes from this date of service were reviewed.    Past Medical History:     Past Medical History:   Diagnosis Date    Cyst (solitary) of breast, left        Past Medical History:   Diagnosis Date    Cyst (solitary) of breast, left      Immunizations:    Past Surgical History:   Past Surgical History[1]    Family History:   Family History[2]    Social History:     Social History     Socioeconomic History    Marital status: Married     Spouse name: Not on file    Number of children: Not on file    Years of education: Not on file    Highest education level: Not on file   Occupational History    Not on file   Tobacco Use    Smoking status: Never    Smokeless tobacco: Never   Vaping Use    Vaping status: Never Used   Substance and Sexual Activity    Alcohol use: Yes     Comment: occ    Drug use: No    Sexual activity: Yes   Other Topics Concern    Not on file   Social History Narrative    Not on file     Social Determinants of Health     Financial Resource Strain: Low Risk  (05/21/2023)    Overall Financial Resource Strain (CARDIA)     Difficulty of Paying Living Expenses: Not hard at all   Food Insecurity: No Food Insecurity (05/21/2023)    Hunger Vital Sign     Worried About Running Out of Food in the Last Year: Never true     Ran Out of Food in the Last Year: Never true   Transportation Needs: No  Transportation Needs (05/21/2023)    PRAPARE - Therapist, art (Medical): No     Lack of Transportation (Non-Medical): No   Physical Activity: Insufficiently Active (05/21/2023)    Exercise Vital Sign     Days of Exercise per Week: 3 days     Minutes of Exercise per Session: 40 min   Stress: No Stress Concern Present (05/21/2023)    [employer] of Occupational Health - Occupational Stress Questionnaire     Feeling of Stress : Only Rowe little   Social Connections: Moderately Integrated (05/21/2023)    Social Connection and Isolation Panel [NHANES]     Frequency of Communication with Friends and Family: Once Rowe week     Frequency of Social Gatherings with Friends and Family: Once Rowe week     Attends Religious Services: More than 4 times per year     Active Member of  Clubs or Organizations: Yes     Attends Banker Meetings: More than 4 times per year     Marital Status: Married   Catering manager Violence: Not At Risk (05/21/2023)    Humiliation, Afraid, Rape, and Kick questionnaire     Fear of Current or Ex-Partner: No     Emotionally Abused: No     Physically Abused: No     Sexually Abused: No   Housing Stability: Low Risk  (05/21/2023)    Housing Stability Vital Sign     Unable to Pay for Housing in the Last Year: No     Number of Times Moved in the Last Year: 0     Homeless in the Last Year: No       Allergies:   Allergies[3]    Medications:   Current Medications[4]    Review of Systems:   Constitutional: No fever or chills, no sweats   Eyes: No eye redness. No eye discharge. No eye pain.   ENT: No ear pain or sore throat. No congestion   Cardiovascular: No cp or palpitations. No leg swelling   Respiratory: No cough or shortness of breath.  GI: No abd pain, nausea, vomiting or diarrhea.  Genitourinary: No dysuria or hematuria or urinary frequency   Musculoskeletal: No extremity pain or decreased use; no back pain   Skin: no rash or skin lesions. +cyst  Neurologic: Normal level of  alertness; no headache or dizziness   Psychiatric:  All other systems reviewed and are negative    Physical Exam:     ED Triage Vitals [06/19/23 1203]   Encounter Vitals Group      BP (!) 158/100      Systolic BP Percentile       Diastolic BP Percentile       Heart Rate 81      Resp Rate 18      Temp 97.7 F (36.5 C)      Temp src Temporal      SpO2 98 %      Weight 93 kg      Height 1.626 m      Head Circumference       Peak Flow       Pain Score 7      Pain Loc       Pain Education       Exclude from Growth Chart      Constitutional: Vital signs reviewed. Well hydrated, well perfused, and no increased work of breathing. Appearance: non-toxic, nad.  Head: Normocephalic, atraumatic  Eyes: No conjunctival injection. No discharge. EOMI  ENT: Mucous membranes moist, No oral lesions  Neck: Normal range of motion.   Respiratory/Chest: Clear to auscultation. No respiratory distress.   Cardiovascular: Regular rate and rhythm.   UpperExtremity: No edema or cyanosis.  Moving well.  LowerExtremity: No edema or cyanosis.  Moving well.  Neurological: AAOx3. CN 2-12 intact, strength/sensation intact. Speech normal. Memory normal. No focal neuro deficits.    Skin: Warm and dry. No rash. 3x2 cm area of edema with central fluctuance, surrounding erythema, warmth, and ttp - no drainage       Labs:   Labs Reviewed - No data to display      Rads:     Radiology Results (24 Hour)       ** No results found for the last 24 hours. **            MDM and ED Course  I, Maria Hack Rowe. Glynnis Gavel PA-C, have been the primary provider for Maria Rowe during this Emergency Dept visit.  Nursing notes, PMH, SH reviewed.   The attending signature signifies review and agreement of the history, physical examination, evaluation, clinical impression, and plan except as noted.       Oxygen saturation by pulse oximetry is 95%-100%, Normal.  Interventions: None Needed.    Vital Signs: Reviewed the patient's vital signs.     Nursing Notes: Reviewed and  utilized available nursing notes.        Number and Complexity of Problems Addressed (select at least one)    Complexity: Low: 1 acute, uncomplicated illness or injury     Presenting acute/chronic problems: cyst    Differential diagnosis to include but not limited to: abscess, cyst, cellulitis    Chronic illness impacting care and increasing risk of acute/chronic problems (obesity, diabetes, hypertension, elderly): n/Rowe    ______________________________________________________________________  Amount/complexity of Data Reviewed   L\  Look below for information    History obtained from another historian (parent, spouse,  care giver, ems) : patient    Review of older external records: n/Rowe     Independent interpretation of  EKG by me, and reviewed by EDP: n/Rowe     Independent visualized and interpretation of radiological study by me:     Type of radiological study performed: n/Rowe  Independent Interpretation by me: n/Rowe    Diagnostic tests appropriately considered even if not ultimately performed: n/Rowe    Shared decision making with patient on all diagnostics today including imaging, labs, medications.     Discussion of management with other physicians and/or other caretakers:   Discussion with  none.   Patient condition and all pertinent labs and/or radiology studies discussed.     ______________________________________________________________________      Risk of Complications and/or Morbidity or Mortality of Patient Management  (select one if applicable)    Risk: Low Risk and Prescription drug management       ED COURSE: 52 y/o female p/w painful cyst to chest wall for one week but worse in the last few days with redness. No fevers or drainage. Pt states she had Rowe normal mammogram on Friday. On exam pt is non-toxic, nad, lungs ctab, rr; 3x2 cm area of edema with central fluctuance, surrounding erythema, warmth, and ttp - no drainage           Pt tolerated procedure well, will d/c w/ rx for keflex.     VSS and stable for  discharge. Follow up with PCP for wound check in 2 days; will give outpatient referral to general surgery as well. Strict return precautions reviewed. Pt expressed understanding, agrees with tx plan, and comfortable with d/c home.     Incision/Drainage    Date/Time: 06/19/2023 6:51 PM    Performed by: Antony Contras, PA  Authorized by: Garey Ham, DO    Consent:     Consent obtained:  Verbal    Consent given by:  Patient    Risks, benefits, and alternatives were discussed: yes      Risks discussed:  Incomplete drainage  Universal protocol:     Patient identity confirmed:  Verbally with patient  Location:     Type:  Cyst    Size:  3x2cm    Location:  Trunk    Trunk location:  Chest  Anesthesia:     Anesthesia method:  Local infiltration    Local anesthetic:  Lidocaine 1% w/o  epi  Procedure type:     Complexity:  Simple  Procedure details:     Incision types:  Single straight    Incision depth:  Dermal    Wound management:  Probed and deloculated and irrigated with saline    Drainage:  Serosanguinous, purulent and bloody    Drainage amount:  Moderate    Instrument(s) used:  Scalpel #11    Wound treatment:  Wound left open    Packing materials:  None  Post-procedure details:     Procedure completion:  Tolerated well, no immediate complications          Assessment/Plan:   Results and instructions reviewed at the bedside with patient and family.    Clinical Impression  Final diagnoses:   Infected cyst of skin       Disposition  ED Disposition       ED Disposition   Discharge    Condition   --    Date/Time   Tue Jun 19, 2023  2:17 PM    Comment   Maria Rowe discharge to home/self care.    Condition at disposition: Stable                 Prescriptions  Discharge Medication List as of 06/19/2023  2:20 PM        START taking these medications    Details   cephALEXin (KEFLEX) 500 MG capsule Take 1 capsule (500 mg) by mouth every 8 (eight) hours for 7 days, Starting Tue 06/19/2023, Until Tue 06/26/2023, E-Rx                  Signed by: Aashritha Miedema Rowe Jarmar Rousseau, PA, PA-C           [1]   Past Surgical History:  Procedure Laterality Date    CESAREAN SECTION      x3     TUBAL LIGATION      2010   [2]   Family History  Problem Relation Name Age of Onset    Heart disease Mother          CHF    Heart disease Father      Cancer Maternal Aunt          breast    Cancer Paternal Aunt          Breast    Cancer Maternal Grandmother      Cancer Paternal Grandmother     [3] No Known Allergies  [4] No current facility-administered medications for this encounter.    Current Outpatient Medications:     cephALEXin (KEFLEX) 500 MG capsule, Take 1 capsule (500 mg) by mouth every 8 (eight) hours for 7 days, Disp: 21 capsule, Rfl: 0       Carrieanne Kleen A, PA  06/23/23 1829

## 2023-06-19 NOTE — ED Triage Notes (Signed)
Ambulatory to triage with cyst to left breast that in the last week has become more red and swollen, now painful and hot to the touch. Denies drainage from the cyst or fevers. Mamogram on Friday was normal.

## 2023-06-21 ENCOUNTER — Encounter (INDEPENDENT_AMBULATORY_CARE_PROVIDER_SITE_OTHER): Payer: Self-pay | Admitting: Family Medicine

## 2023-06-21 ENCOUNTER — Ambulatory Visit (INDEPENDENT_AMBULATORY_CARE_PROVIDER_SITE_OTHER): Payer: Commercial Managed Care - POS | Admitting: Family Medicine

## 2023-06-21 VITALS — BP 116/76 | HR 76 | Temp 98.0°F | Resp 18 | Ht 64.5 in | Wt 211.0 lb

## 2023-06-21 DIAGNOSIS — Z23 Encounter for immunization: Secondary | ICD-10-CM

## 2023-06-21 DIAGNOSIS — L089 Local infection of the skin and subcutaneous tissue, unspecified: Secondary | ICD-10-CM

## 2023-06-21 DIAGNOSIS — L729 Follicular cyst of the skin and subcutaneous tissue, unspecified: Secondary | ICD-10-CM

## 2023-06-21 DIAGNOSIS — Z0000 Encounter for general adult medical examination without abnormal findings: Secondary | ICD-10-CM

## 2023-06-21 NOTE — Progress Notes (Signed)
Date: 06/21/2023 4:05 PM   Patient ID: Maria Rowe is a 52 y.o. female.         Have you seen any specialists since your last visit with [place]? 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   Reported Health: good health  Reported Diet: moderate compliance with well-balanced diet  Reported Exercise: 3-4x/week, 30-60 minutes/day, and walking  Dental: regular dental visits twice a year  Vision: glasses and eye exam < 1 year ago  Hearing: normal hearing  Immunization Status: Shingles vaccination due, Influenza vaccination due, and COVID booster due  Reproductive Health: sexually active  Prior Screening Tests: last Cologuard 2023, pap smear within past 1-3 years, and mammogram < 1 year ago  General Health Risks: no family history of prostate cancer, no family history of colon cancer, and family history of breast cancer  Safety Elements Used: uses seat belts, smoke detectors in household, carbon monoxide detectors in household, sunscreen use, and does not text and drive    Problem List:  Problem List[1]    Current Medications:  Medications Taking[2]     Allergies:  Allergies[3]    Past Medical History:  Medical History[4]    Past Surgical History:  Past Surgical History[5]    Family History:  Family History[6]    Social History:  Social History[7]     The following sections were reviewed this encounter by the provider:   Problems         Vitals:  BP 116/76 (BP Site: Left arm, Patient Position: Sitting, Cuff Size: Medium)   Pulse 76   Temp 98 F (36.7 C) (Temporal)   Resp 18   Ht 1.638 m (5' 4.5")   Wt 95.7 kg (211 lb)   SpO2 96%   BMI 35.66 kg/m         ROS:  General ROS:  negative for - chills, fatigue, fever, hot flashes, malaise, night sweats, sleep disturbance, weight gain or weight loss  Psychological ROS: negative for - anxiety, concentration difficulties, depression, hallucinations or memory difficulties  Ophthalmic ROS: negative for - blurry vision, double vision, eye pain or photophobia  ENT ROS: negative for - hearing change, nasal congestion, nasal discharge, sinus pain, sore throat, tinnitus or vertigo  Allergy and Immunology ROS: negative for - hives, itchy/watery eyes, nasal congestion, postnasal drip or seasonal allergies  Hematological and Lymphatic ROS: negative for - bleeding problems, bruising, fatigue, night sweats, swollen lymph nodes or weight loss  Endocrine ROS: negative for - hot flashes, polydipsia/polyuria, temperature intolerance or unexpected weight changes  Breast ROS: negative for - new or changing breast lumps or nipple discharge  Respiratory ROS: negative for - cough, orthopnea, shortness of breath, sputum changes or wheezing  Cardiovascular ROS: negative for - chest pain, edema, irregular heartbeat, palpitations or shortness of breath  Gastrointestinal ROS: negative for - abdominal pain, appetite loss, blood in stools, constipation, diarrhea, gas/bloating, heartburn or nausea/vomiting  Genito-Urinary ROS: negative for - dysuria, hematuria or urinary frequency/urgency  Musculoskeletal ROS: negative for - joint pain, joint swelling, muscle pain or muscular weakness  Neurological  ROS: negative for - dizziness, headaches or numbness/tingling  Dermatological ROS: negative for - dry skin, pruritus and rash         Objective:       Physical Exam:  General appearance - alert, well appearing, and in no distress, oriented to person, place, and time and normal appearing weight  Mental status - alert, oriented to person, place, and time, normal mood, appropriate behavior, speech, dress, motor activity, and thought processes  Eyes - pupils equal and reactive,  extraocular eye movements intact  Ears - bilateral TM's and external ear canals normal  Nose - normal and patent, no erythema, discharge or polyps and normal nontender sinuses  Mouth - mucous membranes moist, pharynx normal without lesions  Neck - supple, no significant adenopathy, no cervical lymphadenopathy  Chest - clear to auscultation, no wheezes, rales or rhonchi, symmetric air entry  Heart - normal rate, regular rhythm, normal S1, S2, no murmurs, rubs, clicks or gallops  Abdomen - soft, nontender, nondistended, no masses or organomegaly  Neurological - alert, oriented, normal speech, no focal findings or movement disorder noted  Musculoskeletal - no joint tenderness, deformity or swelling  Extremities - peripheral pulses normal, no pedal edema, no clubbing or cyanosis  Skin - normal coloration and turgor, no rashes, no suspicious skin lesions noted      Assessment/Plan:       1. Well adult exam  - CBC with Differential (Order); Future  - Comprehensive Metabolic Panel; Future  - TSH; Future  - Lipid Panel; Future  - Hemoglobin A1C; Future  - Follow Up In Primary Care  - Follow Up In Primary Care; Future    2. Need for vaccination  - Zoster Vaccine Recomb,Adjuvanted (IM)  - Flu vaccine, TRIVALENT, 6 months and older (FLUARIX/FLULAVAL/FLUZONE), single-dose PF, 0.5 mL    3. Infected cyst of skin  -  Take antibiotics as prescribed and to completion.  Advise taking medication with food to avoid abdominal upset.  Also recommended taking a probiotic to avoid diarrhea.  -  Recommended to follow up with her dematologist        Marisa Cyphers, DO           [1]   Patient Active Problem List  Diagnosis    Neck pain    DDD (degenerative disc disease), cervical   [2]   Outpatient Medications Marked as Taking for the 06/21/23 encounter (Office Visit) with Marisa Cyphers, DO   Medication Sig Dispense Refill    cephALEXin (KEFLEX) 500 MG capsule Take 1 capsule (500 mg) by mouth every 8 (eight) hours for 7 days 21 capsule  0   [3] No Known Allergies  [4]   Past Medical History:  Diagnosis Date    Cyst (solitary) of breast, left    [5]   Past Surgical History:  Procedure Laterality Date    CESAREAN SECTION      x3     TUBAL LIGATION      2010   [6]   Family History  Problem Relation Name Age of Onset    Heart disease Mother          CHF    Heart disease Father      Cancer Maternal Aunt          breast    Cancer Paternal Aunt          Breast    Cancer Maternal Grandmother      Cancer Paternal Grandmother     [  7]   Social History  Tobacco Use    Smoking status: Never    Smokeless tobacco: Never   Vaping Use    Vaping status: Never Used   Substance Use Topics    Alcohol use: Yes     Comment: occ    Drug use: No

## 2023-06-22 ENCOUNTER — Other Ambulatory Visit (FREE_STANDING_LABORATORY_FACILITY): Payer: Commercial Managed Care - POS

## 2023-06-22 DIAGNOSIS — Z0000 Encounter for general adult medical examination without abnormal findings: Secondary | ICD-10-CM

## 2023-06-22 LAB — LAB USE ONLY - CBC WITH DIFFERENTIAL
Absolute Basophils: 0.04 10*3/uL (ref 0.00–0.08)
Absolute Eosinophils: 0.1 10*3/uL (ref 0.00–0.44)
Absolute Immature Granulocytes: 0.05 10*3/uL (ref 0.00–0.07)
Absolute Lymphocytes: 1.45 10*3/uL (ref 0.42–3.22)
Absolute Monocytes: 0.79 10*3/uL (ref 0.21–0.85)
Absolute Neutrophils: 8.53 10*3/uL — ABNORMAL HIGH (ref 1.10–6.33)
Absolute nRBC: 0 10*3/uL (ref <=0.00)
Basophils %: 0.4 %
Eosinophils %: 0.9 %
Hematocrit: 43.5 % (ref 34.7–43.7)
Hemoglobin: 14.8 g/dL (ref 11.4–14.8)
Immature Granulocytes %: 0.5 %
Lymphocytes %: 13.2 %
MCH: 31.4 pg (ref 25.1–33.5)
MCHC: 34 g/dL (ref 31.5–35.8)
MCV: 92.2 fL (ref 78.0–96.0)
MPV: 8.9 fL (ref 8.9–12.5)
Monocytes %: 7.2 %
Neutrophils %: 77.8 %
Platelet Count: 315 10*3/uL (ref 142–346)
Preliminary Absolute Neutrophil Count: 8.53 10*3/uL — ABNORMAL HIGH (ref 1.10–6.33)
RBC: 4.72 10*6/uL (ref 3.90–5.10)
RDW: 13 % (ref 11–15)
WBC: 10.96 10*3/uL — ABNORMAL HIGH (ref 3.10–9.50)
nRBC %: 0 /100{WBCs} (ref <=0.0)

## 2023-06-22 LAB — COMPREHENSIVE METABOLIC PANEL
ALT: 27 U/L (ref 0–55)
AST (SGOT): 22 U/L (ref 5–41)
Albumin/Globulin Ratio: 1.4 (ref 0.9–2.2)
Albumin: 4.2 g/dL (ref 3.5–5.0)
Alkaline Phosphatase: 71 U/L (ref 37–117)
Anion Gap: 9 (ref 5.0–15.0)
BUN: 13 mg/dL (ref 7–21)
Bilirubin, Total: 0.7 mg/dL (ref 0.2–1.2)
CO2: 23 meq/L (ref 17–29)
Calcium: 9 mg/dL (ref 8.5–10.5)
Chloride: 104 meq/L (ref 99–111)
Creatinine: 0.8 mg/dL (ref 0.4–1.0)
GFR: >60 mL/min/{1.73_m2} (ref >=60.0)
Globulin: 2.9 g/dL (ref 2.0–3.6)
Glucose: 81 mg/dL (ref 70–100)
Hemolysis Index: 6 {index}
Potassium: 4.4 meq/L (ref 3.5–5.3)
Protein, Total: 7.1 g/dL (ref 6.0–8.3)
Sodium: 136 meq/L (ref 135–145)

## 2023-06-22 LAB — HEMOGLOBIN A1C
Average Estimated Glucose: 105.4 mg/dL
Hemoglobin A1C: 5.3 % (ref 4.6–5.6)

## 2023-06-22 LAB — LIPID PANEL
Cholesterol / HDL Ratio: 3.1 {index}
Cholesterol: 178 mg/dL (ref <=199)
HDL: 57 mg/dL (ref >=40)
LDL Calculated: 90 mg/dL (ref 0–99)
Triglycerides: 156 mg/dL — ABNORMAL HIGH (ref 34–149)
VLDL Calculated: 31 mg/dL (ref 10–40)

## 2023-06-22 LAB — TSH: TSH: 0.75 u[IU]/mL (ref 0.35–4.94)

## 2023-06-26 ENCOUNTER — Other Ambulatory Visit (INDEPENDENT_AMBULATORY_CARE_PROVIDER_SITE_OTHER): Payer: Self-pay | Admitting: Family Medicine

## 2023-06-26 DIAGNOSIS — D72829 Elevated white blood cell count, unspecified: Secondary | ICD-10-CM

## 2023-07-12 ENCOUNTER — Encounter (INDEPENDENT_AMBULATORY_CARE_PROVIDER_SITE_OTHER): Payer: Self-pay | Admitting: Family Medicine

## 2023-10-22 ENCOUNTER — Encounter (INDEPENDENT_AMBULATORY_CARE_PROVIDER_SITE_OTHER): Payer: Self-pay | Admitting: Nurse Practitioner

## 2023-10-22 ENCOUNTER — Telehealth (INDEPENDENT_AMBULATORY_CARE_PROVIDER_SITE_OTHER): Payer: Commercial Managed Care - POS | Admitting: Nurse Practitioner

## 2023-10-22 VITALS — Ht 64.0 in | Wt 215.0 lb

## 2023-10-22 DIAGNOSIS — U071 COVID-19: Secondary | ICD-10-CM

## 2023-10-22 MED ORDER — NIRMATRELVIR&RITONAVIR 300/100 20 X 150 MG & 10 X 100MG PO TBPK
3.0000 | ORAL_TABLET | Freq: Two times a day (BID) | ORAL | 0 refills | Status: AC
Start: 2023-10-22 — End: 2023-10-27

## 2023-10-22 NOTE — Progress Notes (Signed)
 Have you seen any specialists since your last visit with [place]?  No      The patient was informed that the following HM items are still outstanding:   N/A

## 2023-10-22 NOTE — Progress Notes (Signed)
 SUBJECTIVE:   The patient Maria Rowe is having a Video visit today to adress the following concerns:   Chief Complaint   Patient presents with    Covid-19     Pt tested positive for covid, has a cough, body ache, and fatigue.      Verbal consent has been obtained from the patient to conduct this video visit encounter to minimize exposure to COVID-19    HPI  Patient is here for a follow up Telemedicine visit.    Patient reports she took an at home Covid test and it was positive.   The patient  is complaining of generalized aches, chills, cough, runny nose and post nasal drip x 1 day.   She denies any fever.   Patient reports they have tried Dayquil and Alkaseltzer and afrin nasal spray  for their symptoms with mild relief of symptoms.       Last CMP was in 06/2023 and it was greater than 60, Creat 0.8    Past medical hx, past surg hx, soc hx, fam hx, allergies and medications all have been reviewed today in [facility].     REVIEW OF SYSTEMS:  Review of systems was negative except for:  See HPI       PHYSICAL EXAM  Constitutional: well nourished, well developed in no acute distress  Neuro: Awake and alert, oriented x 3  Pulm: no labored breathing noted, no use of accessory muscles, breathing comfortably on room air  No respiratory distress observed during tele-medicine visit.   Speaking in complete sentences.  Psych: normal affect with normal insight, non anxious    Patient reports that she   is in [place]  at the time of the video visit.     Verbal consent has been obtained from the patient to conduct this video visit      ASSESSMENT / PLAN:     Maria Rowe was seen today for covid-19.    Diagnoses and all orders for this visit:    COVID-19  -     nirmatrelvir -ritonavir  (PAXLOVID ) 20 x 150 MG & 10 x 100MG  dose pack(emergency use authorization); Take 3 tablets by mouth 2 (two) times daily for 5 days The dosage for PAXLOVID  is 300 mg nirmatrelvir  (two 150 mg tablets) with 100 mg ritonavir  (one 100 mg tablet)  with all three tablets taken together.    Patient is Positive for Covid--19, Advised to take Tylenol  or ibuprofen  as needed for fever.   Drink 60 ounces or 8 glasses of water.   Patient advised if her symptoms persist to follow-up with a primary care doctor for an in person appointment  Patient is in agreement of this plan.     Time spent doing the telemedicine visit with the patient today was 10 minutes.  Please see the Patient instruction section below for the plan and my recommendations.  Potential medication side effects were discussed with the patient today.  The management plan was discussed with the patient and all questioned fully answered.     Patient Instructions   Take your covid medication as prescribed.   Common side effects of medications such as change of taste, diarrhea, and hepatitis were discussed with patient and all questions were addressed.    If you experience any dizziness, chest pain, trouble breathing, change in vision, confusion, weakness, numbness, tingling, severe headache, nausea, vomiting, severe abdominal pain, or change in speech --- go to Emergency department immediately.

## 2023-10-22 NOTE — Patient Instructions (Signed)
Take your covid medication as prescribed.   Common side effects of medications such as change of taste, diarrhea, and hepatitis were discussed with patient and all questions were addressed.       If you experience any dizziness, chest pain, trouble breathing, change in vision, confusion, weakness, numbness, tingling, severe headache, nausea, vomiting, severe abdominal pain, or change in speech --- go to Emergency department immediately.

## 2023-11-16 ENCOUNTER — Encounter (INDEPENDENT_AMBULATORY_CARE_PROVIDER_SITE_OTHER): Payer: Self-pay | Admitting: Family Medicine

## 2023-11-16 ENCOUNTER — Ambulatory Visit (INDEPENDENT_AMBULATORY_CARE_PROVIDER_SITE_OTHER): Payer: Commercial Managed Care - POS | Admitting: Family Medicine

## 2023-11-16 VITALS — BP 114/79 | HR 87 | Temp 97.6°F | Resp 18 | Wt 223.0 lb

## 2023-11-16 DIAGNOSIS — E66812 Obesity, class 2: Secondary | ICD-10-CM

## 2023-11-16 DIAGNOSIS — Z6838 Body mass index (BMI) 38.0-38.9, adult: Secondary | ICD-10-CM

## 2023-11-16 DIAGNOSIS — Z780 Asymptomatic menopausal state: Secondary | ICD-10-CM | POA: Insufficient documentation

## 2023-11-16 DIAGNOSIS — Z23 Encounter for immunization: Secondary | ICD-10-CM

## 2023-11-16 DIAGNOSIS — Z7689 Persons encountering health services in other specified circumstances: Secondary | ICD-10-CM

## 2023-11-16 DIAGNOSIS — E6609 Other obesity due to excess calories: Secondary | ICD-10-CM

## 2023-11-16 MED ORDER — PROGESTERONE 100 MG PO CAPS
100.0000 mg | ORAL_CAPSULE | Freq: Every day | ORAL | 3 refills | Status: AC
Start: 2023-11-16 — End: ?

## 2023-11-16 MED ORDER — ESTRADIOL 0.025 MG/24HR TD PTTW
1.0000 | MEDICATED_PATCH | TRANSDERMAL | 2 refills | Status: AC
Start: 2023-11-16 — End: ?

## 2023-11-16 NOTE — Progress Notes (Signed)
 Have you seen any specialists since your last visit with us ? No  The patient was informed that the following HM items are still outstanding: mammogram, shingles vaccine, pneumonia vaccine, and Covid Vaccine    Subjective:      Date: 11/16/2023 9:04 AM   Patient ID: Maria Rowe is a 53 y.o. female.    Chief Complaint:  Chief Complaint   Patient presents with    Weight Management     To discuss about weight loss       HPI:  Pt presents to discuss weight management.  She was last seen for her physical on 06/21/23.    Always struggled with overweight/obesity  Trying to go to the gym every morning    Also the past year more struggle with hot flashes, menopause  Estroven?    WEIGHTS  Today: 223 lb  06/21/23: 211 lb    Advise to check into coverage for obesity medications  Oral:  Phentermine  Contrave (naltrexone/buproprion)  Qsymia (phentermine/topiramate)    Injectable:  Wegovy  or Zepbound    Problem List:  Problem List[1]    Current Medications:  Medications Taking[2]    Allergies:  Allergies[3]    Past Medical History:  Medical History[4]    Past Surgical History:  Past Surgical History[5]    Family History:  Family History[6]    Social History:  Social History[7]     The following sections were reviewed this encounter by the provider:   Problems         Vitals:  BP 114/79 (BP Site: Left arm, Patient Position: Sitting, Cuff Size: Large)   Pulse 87   Temp 97.6 F (36.4 C) (Temporal)   Resp 18   Wt 101.2 kg (223 lb)   SpO2 95%   BMI 38.28 kg/m   Wt Readings from Last 3 Encounters:   11/16/23 101.2 kg (223 lb)   10/22/23 97.5 kg (215 lb)   06/21/23 95.7 kg (211 lb)       ROS:  General ROS: negative for - chills, fatigue, fever; positive for - weight gain  Endocrine ROS: positive for - hot flashes, temperature intolerance  Respiratory ROS: negative for - cough, orthopnea, shortness of breath, sputum changes or wheezing  Cardiovascular ROS: negative for - chest pain, edema, irregular heartbeat, palpitations or  shortness of breath  Gastrointestinal ROS: negative for - abdominal pain, appetite loss, blood in stools, constipation, diarrhea, gas/bloating, heartburn or nausea/vomiting  Musculoskeletal ROS: negative for - joint pain, joint swelling, muscle pain or muscular weakness  Neurological ROS: negative for - dizziness, headaches or numbness/tingling  Dermatological ROS: negative for - dry skin, pruritus and rash      Objective:       Physical Exam:  General appearance - alert, well appearing, and in no distress  Mental status - alert, oriented to person, place, and time, normal mood, behavior  Chest - clear to auscultation, no wheezes, rales or rhonchi, symmetric air entry  Heart - normal rate, regular rhythm, normal S1, S2, no murmurs, rubs, clicks or gallops  Skin - normal coloration and turgor, no rashes, no suspicious skin lesions noted      Assessment/Plan:       1. Encounter for weight management    2. Class 2 obesity due to excess calories without serious comorbidity with body mass index (BMI) of 38.0 to 38.9 in adult  - Follow Up In Primary Care; Future    3. Menopause  - Progesterone  100 MG Cap;  Take 1 capsule (100 mg) by mouth daily  Dispense: 90 capsule; Refill: 3  - estradiol  (VIVELLE -DOT) 0.025 MG/24HR; Place 1 patch onto the skin twice a week  Dispense: 8 patch; Refill: 2  - Follow Up In Primary Care; Future    4. Need for vaccination  - Zoster Vaccine Recomb,Adjuvanted (IM)  - Pneumococcal Conjugate 20 - Valent        Marshall LULLA Glatter, DO           [1]   Patient Active Problem List  Diagnosis    Neck pain    DDD (degenerative disc disease), cervical    Menopause   [2]   Outpatient Medications Marked as Taking for the 11/16/23 encounter (Office Visit) with Glatter Marshall LULLA, DO   Medication Sig Dispense Refill    Multiple Vitamins-Minerals (ONE A DAY WOMEN 50 PLUS PO) Take by mouth once      Rhubarb (ESTROVEN MENOPAUSE RELIEF PO) Take by mouth once     [3] No Known Allergies  [4]   Past Medical  History:  Diagnosis Date    Cyst (solitary) of breast, left    [5]   Past Surgical History:  Procedure Laterality Date    CESAREAN SECTION      x3     TUBAL LIGATION      2010   [6]   Family History  Problem Relation Name Age of Onset    Heart disease Mother          CHF    Heart disease Father      Cancer Maternal Aunt          breast    Cancer Paternal Aunt          Breast    Cancer Maternal Grandmother      Cancer Paternal Grandmother     [7]   Social History  Socioeconomic History    Marital status: Married   Tobacco Use    Smoking status: Never    Smokeless tobacco: Never   Vaping Use    Vaping status: Never Used   Substance and Sexual Activity    Alcohol use: Yes     Comment: occ    Drug use: No    Sexual activity: Yes     Social Drivers of Psychologist, Prison And Probation Services Strain: Low Risk  (05/21/2023)    Overall Financial Resource Strain (CARDIA)     Difficulty of Paying Living Expenses: Not hard at all   Food Insecurity: No Food Insecurity (05/21/2023)    Hunger Vital Sign     Worried About Running Out of Food in the Last Year: Never true     Ran Out of Food in the Last Year: Never true   Transportation Needs: No Transportation Needs (05/21/2023)    PRAPARE - Therapist, Art (Medical): No     Lack of Transportation (Non-Medical): No   Physical Activity: Insufficiently Active (05/21/2023)    Exercise Vital Sign     Days of Exercise per Week: 3 days     Minutes of Exercise per Session: 40 min   Stress: No Stress Concern Present (05/21/2023)    [employer] of Occupational Health - Occupational Stress Questionnaire     Feeling of Stress : Only a little   Social Connections: Moderately Integrated (05/21/2023)    Social Connection and Isolation Panel [NHANES]     Frequency of Communication with Friends and Family: Once a  week     Frequency of Social Gatherings with Friends and Family: Once a week     Attends Religious Services: More than 4 times per year     Active Member of [employer] or  Organizations: Yes     Attends Engineer, Structural: More than 4 times per year     Marital Status: Married   Catering Manager Violence: Not At Risk (05/21/2023)    Humiliation, Afraid, Rape, and Kick questionnaire     Fear of Current or Ex-Partner: No     Emotionally Abused: No     Physically Abused: No     Sexually Abused: No   Housing Stability: Low Risk  (05/21/2023)    Housing Stability Vital Sign     Unable to Pay for Housing in the Last Year: No     Number of Times Moved in the Last Year: 0     Homeless in the Last Year: No

## 2023-11-20 ENCOUNTER — Encounter (INDEPENDENT_AMBULATORY_CARE_PROVIDER_SITE_OTHER): Payer: Self-pay | Admitting: Family Medicine

## 2023-12-13 NOTE — Progress Notes (Signed)
 Have you seen any specialists since your last visit with us ? No  The patient was informed that the following HM items are still outstanding: mammogram, shingles vaccine, pneumonia vaccine, and Covid Vaccine    Subjective:      Date: 12/14/2023 10:16 AM   Patient ID: Maria Rowe is a 53 y.o. female.    Chief Complaint:  Chief Complaint   Patient presents with    Menopause     F/up       HPI:  Pt presents to discuss weight management.  She was last seen on 11/16/23 and brought up concern regarding her weight at that time.  She states she has always struggled with overweight/obesity.  She has been trying to go to the gym every morning.  She was last seen for her physical on 06/21/23.    Pt states since her last visit she has been to the gym at least 3-5 times per week.  Doing elliptical and weight training. She also tracked her protein for about a week and found that she was at about 70g/day.    She also reported at her last visit that over the past year she's had increasing menopausal symptoms including significant hot flashes.  She was started last month on oral progesterone  and transdermal estrogen.    Pt is stating she is still getting used to daily administration of medication.  Pt states she had a big improvement in her night sweats.      WEIGHTS  Today: reports no change but 217 at home  11/16/23: 223 lb (clinic)  06/21/23: 211 lb      Problem List:  Problem List[1]    Current Medications:  Medications Taking[2]    Allergies:  Allergies[3]    Past Medical History:  Medical History[4]    Past Surgical History:  Past Surgical History[5]    Family History:  Family History[6]    Social History:  Social History[7]     The following sections were reviewed this encounter by the provider: Meds    Problems         Vitals:  There were no vitals taken for this visit.  Wt Readings from Last 3 Encounters:   11/16/23 101.2 kg (223 lb)   10/22/23 97.5 kg (215 lb)   06/21/23 95.7 kg (211 lb)       ROS:  General ROS: negative  for - chills, fatigue, fever; positive for - difficulty with weight management  Endocrine ROS: positive for - hot flashes, temperature intolerance  Respiratory ROS: negative for - cough, orthopnea, shortness of breath, sputum changes or wheezing  Cardiovascular ROS: negative for - chest pain, edema, irregular heartbeat, palpitations or shortness of breath  Gastrointestinal ROS: negative for - abdominal pain, appetite loss, blood in stools, constipation, diarrhea, gas/bloating, heartburn or nausea/vomiting  Musculoskeletal ROS: negative for - joint pain, joint swelling, muscle pain or muscular weakness  Neurological ROS: negative for - dizziness, headaches or numbness/tingling  Dermatological ROS: negative for - dry skin, pruritus and rash      Objective:       Physical Exam:  General appearance - alert, well appearing, and in no distress  Mental status - alert, oriented to person, place, and time, normal mood, behavior  Chest - clear to auscultation, no wheezes, rales or rhonchi, symmetric air entry  Heart - normal rate, regular rhythm, normal S1, S2, no murmurs, rubs, clicks or gallops  Skin - normal coloration and turgor, no rashes, no suspicious skin lesions  noted      Assessment/Plan:       1. Encounter for weight management    2. Class 2 obesity due to excess calories without serious comorbidity with body mass index (BMI) of 38.0 to 38.9 in adult  - Follow Up In Primary Care  - semaglutide  (Wegovy ) 0.25 MG/0.5ML injection; Inject 0.5 mLs (0.25 mg) into the skin once a week for 4 doses  Dispense: 2 mL; Refill: 0  - Follow Up In Primary Care; Future    3. Menopause  - Follow Up In Primary Care        Marshall LULLA Glatter, DO           [1]   Patient Active Problem List  Diagnosis    Neck pain    DDD (degenerative disc disease), cervical    Menopause   [2]   Outpatient Medications Marked as Taking for the 12/14/23 encounter (Telemedicine Visit) with Glatter Marshall LULLA, DO   Medication Sig Dispense Refill    estradiol   (VIVELLE -DOT) 0.025 MG/24HR Place 1 patch onto the skin twice a week 8 patch 2    Multiple Vitamins-Minerals (ONE A DAY WOMEN 50 PLUS PO) Take by mouth once      Progesterone  100 MG Cap Take 1 capsule (100 mg) by mouth daily 90 capsule 3   [3] No Known Allergies  [4]   Past Medical History:  Diagnosis Date    Cyst (solitary) of breast, left    [5]   Past Surgical History:  Procedure Laterality Date    CESAREAN SECTION      x3     TUBAL LIGATION      2010   [6]   Family History  Problem Relation Name Age of Onset    Heart disease Mother          CHF    Heart disease Father      Cancer Maternal Aunt          breast    Cancer Paternal Aunt          Breast    Cancer Maternal Grandmother      Cancer Paternal Grandmother     [7]   Social History  Socioeconomic History    Marital status: Married   Tobacco Use    Smoking status: Never    Smokeless tobacco: Never   Vaping Use    Vaping status: Never Used   Substance and Sexual Activity    Alcohol use: Yes     Comment: occ    Drug use: No    Sexual activity: Yes     Social Drivers of Psychologist, Prison And Probation Services Strain: Low Risk  (05/21/2023)    Overall Financial Resource Strain (CARDIA)     Difficulty of Paying Living Expenses: Not hard at all   Food Insecurity: No Food Insecurity (05/21/2023)    Hunger Vital Sign     Worried About Running Out of Food in the Last Year: Never true     Ran Out of Food in the Last Year: Never true   Transportation Needs: No Transportation Needs (05/21/2023)    PRAPARE - Therapist, Art (Medical): No     Lack of Transportation (Non-Medical): No   Physical Activity: Insufficiently Active (05/21/2023)    Exercise Vital Sign     Days of Exercise per Week: 3 days     Minutes of Exercise per Session: 40 min   Stress:  No Stress Concern Present (05/21/2023)    [employer] of Occupational Health - Occupational Stress Questionnaire     Feeling of Stress : Only a little   Social Connections: Moderately Integrated (05/21/2023)     Social Connection and Isolation Panel [NHANES]     Frequency of Communication with Friends and Family: Once a week     Frequency of Social Gatherings with Friends and Family: Once a week     Attends Religious Services: More than 4 times per year     Active Member of [employer] or Organizations: Yes     Attends Banker Meetings: More than 4 times per year     Marital Status: Married   Catering Manager Violence: Not At Risk (05/21/2023)    Humiliation, Afraid, Rape, and Kick questionnaire     Fear of Current or Ex-Partner: No     Emotionally Abused: No     Physically Abused: No     Sexually Abused: No   Housing Stability: Low Risk  (05/21/2023)    Housing Stability Vital Sign     Unable to Pay for Housing in the Last Year: No     Number of Times Moved in the Last Year: 0     Homeless in the Last Year: No

## 2023-12-14 ENCOUNTER — Encounter (INDEPENDENT_AMBULATORY_CARE_PROVIDER_SITE_OTHER): Payer: Self-pay | Admitting: Family Medicine

## 2023-12-14 ENCOUNTER — Telehealth (INDEPENDENT_AMBULATORY_CARE_PROVIDER_SITE_OTHER): Admitting: Family Medicine

## 2023-12-14 ENCOUNTER — Telehealth (INDEPENDENT_AMBULATORY_CARE_PROVIDER_SITE_OTHER): Payer: Self-pay | Admitting: Family Medicine

## 2023-12-14 DIAGNOSIS — Z7689 Persons encountering health services in other specified circumstances: Secondary | ICD-10-CM

## 2023-12-14 DIAGNOSIS — E6609 Other obesity due to excess calories: Secondary | ICD-10-CM

## 2023-12-14 DIAGNOSIS — Z780 Asymptomatic menopausal state: Secondary | ICD-10-CM

## 2023-12-14 DIAGNOSIS — Z6838 Body mass index (BMI) 38.0-38.9, adult: Secondary | ICD-10-CM

## 2023-12-14 DIAGNOSIS — E66812 Obesity, class 2: Secondary | ICD-10-CM

## 2023-12-14 MED ORDER — SEMAGLUTIDE-WEIGHT MANAGEMENT 0.25 MG/0.5ML SC SOAJ
0.2500 mg | SUBCUTANEOUS | 0 refills | Status: AC
Start: 2023-12-14 — End: 2024-01-05

## 2023-12-14 NOTE — Telephone Encounter (Signed)
 Helped pt to join video visit.

## 2023-12-14 NOTE — Progress Notes (Signed)
Have you seen any specialists since your last visit with [place]?  Yes Dermatologist  The patient was informed that the following HM items are still outstanding:   Covid Vaccine

## 2023-12-19 ENCOUNTER — Encounter (INDEPENDENT_AMBULATORY_CARE_PROVIDER_SITE_OTHER): Payer: Self-pay | Admitting: Family Medicine

## 2023-12-19 ENCOUNTER — Telehealth (INDEPENDENT_AMBULATORY_CARE_PROVIDER_SITE_OTHER): Payer: Self-pay | Admitting: Family Medicine

## 2023-12-19 NOTE — Telephone Encounter (Signed)
 [facility] PA sent for approval.

## 2023-12-20 ENCOUNTER — Telehealth (INDEPENDENT_AMBULATORY_CARE_PROVIDER_SITE_OTHER): Payer: Self-pay | Admitting: Family Medicine

## 2023-12-20 NOTE — Telephone Encounter (Signed)
 Your prior authorization for [employer]  has been approved!  More InfoPersonalized support and financial assistance may be available through the [employer] program. For more information, and to see program requirements, click on the More Info button to the right.  Message from plan: Your PA request has been approved. Additional information will be provided in the approval communication. (Message 1145). Authorization Expiration Date: July 20, 2024.

## 2024-01-15 ENCOUNTER — Telehealth (INDEPENDENT_AMBULATORY_CARE_PROVIDER_SITE_OTHER): Payer: Self-pay | Admitting: Family Medicine

## 2024-01-15 NOTE — Progress Notes (Unsigned)
 [facility] [address]     {Vanishing Tip Click a link below to be taken to that activity or part of the chart   Chart Review  Order Review  Review Flowsheets  Labs  Health Maintenance  Immunizations  Allergies  Medications  Problem List  History  Synopsis :55325}  Telehealth:  The {PatientParentGuardian:[id number]} has given verbal consent for delivery of health care via telehealth.   The patient is located at {Patient Location:210763::"Home"} in {State:[place]::"[place] "}  The encounter provider is located at their {Provider Location:210765::"Medical Office"} in {State:[address]107[address]::"[place] "}  Epic Video Client was utilized for Real Time/Synchronous Telehealth.   The time spent in medical discussion during this visit was *** minutes.  {Audio Only Visit (Optional):210762}      Subjective   No chief complaint on file.    52yo F presents for weight f/u.  Started semaglutide  on  Has lost  Denies side effects. Denies abd pain, vision changes, n/v/d, constipation, neck masses.    Review of Systems    General/Constitutional:   Denies Chills. Denies Fever.   Ophthalmologic:   Denies Blurred vision.   Respiratory:   Denies Cough. Denies Shortness of breath. Denies Wheezing.   Cardiovascular:   Denies Chest pain. Denies Chest pain with exertion. Denies Palpitations. Denies  Swelling in hands/feet.   Gastrointestinal:   Denies Abdominal pain. Denies Constipation. Denies Diarrhea. Denies Nausea. Denies  Vomiting.   Neurologic:   Denies Dizziness. Denies Headache.     Objective   There were no vitals taken for this visit.  Physical Exam  GENERAL APPEARANCE: alert, in no acute distress, appearing at baseline  PULMONARY: effort normal, no respiratory distress  MUSCULOSKELETAL: ROM normal  PSYCHIATRIC/BEHAVIORAL: mood normal, thought content normal    Assessment/Plan     Assessment & Plan  Class 2 obesity due to excess calories without serious comorbidity with body mass index (BMI) of 38.0 to 38.9 in  adult         Encounter for weight management           Patient interested in starting semaglutide . Will start on 0.25mg  once weekly for 4 weeks- dose will be increased at 4-week intervals (0.5, 1, 1.7, 2.4mg ) to the recommended dose of 2.5mg  once weekly. Informed patient of side effects- GI (nausea, vomiting, diarrhea), ileus, rare blinding disease and monitoring for sxs of acute pancreatitis and gallbladder disease.  Will need to f/u and monitor every 6 weeks. If patient does not lose 4-5% of body weight after 12 weeks of therapy, will taper drug to be discontinued.

## 2024-01-15 NOTE — Telephone Encounter (Signed)
 Copied from CRM [id number]. Topic: Clinical Support - Speak With Nurse  >> Jan 15, 2024 12:05 PM Rue Cota wrote:  Thurman, Truth A called about Clinical Support - Speak With Nurse.  Additional details:  Patient has reschedule appointment she had with Dr. Brenita Callow due to her been out sick to see Dr. Thersa Flavors for a VV. Please advice if any issues with appointment - call back number [phone]- Attempted to reach office no response.

## 2024-01-16 ENCOUNTER — Telehealth (INDEPENDENT_AMBULATORY_CARE_PROVIDER_SITE_OTHER): Admitting: Family Medicine

## 2024-01-16 ENCOUNTER — Encounter (INDEPENDENT_AMBULATORY_CARE_PROVIDER_SITE_OTHER): Payer: Self-pay | Admitting: Family Medicine

## 2024-01-16 VITALS — Wt 217.0 lb

## 2024-01-16 DIAGNOSIS — E66812 Obesity, class 2: Secondary | ICD-10-CM

## 2024-01-16 DIAGNOSIS — Z7689 Persons encountering health services in other specified circumstances: Secondary | ICD-10-CM

## 2024-01-16 DIAGNOSIS — E6609 Other obesity due to excess calories: Secondary | ICD-10-CM

## 2024-01-16 DIAGNOSIS — Z6838 Body mass index (BMI) 38.0-38.9, adult: Secondary | ICD-10-CM

## 2024-01-16 DIAGNOSIS — Z6837 Body mass index (BMI) 37.0-37.9, adult: Secondary | ICD-10-CM

## 2024-01-16 MED ORDER — SEMAGLUTIDE-WEIGHT MANAGEMENT 0.5 MG/0.5ML SC SOAJ
0.5000 mg | SUBCUTANEOUS | 0 refills | Status: AC
Start: 2024-01-16 — End: 2024-02-07

## 2024-01-16 NOTE — Progress Notes (Signed)
 Have you seen any specialists since your last visit with [place]?  Yes      The patient was informed that the following HM items are still outstanding:   nothing at this time, HM is up-to-date.

## 2024-02-03 ENCOUNTER — Other Ambulatory Visit (INDEPENDENT_AMBULATORY_CARE_PROVIDER_SITE_OTHER): Payer: Self-pay | Admitting: Family Medicine

## 2024-02-03 DIAGNOSIS — Z780 Asymptomatic menopausal state: Secondary | ICD-10-CM

## 2024-02-04 NOTE — Telephone Encounter (Signed)
 Medication(s) Requested: Estradiol  0.025 mg    Medication(s) last refilled: Nov 16 2023, Qty  8 , Refills 2  Last visit: January 16 2024  Patient has an upcoming appointment on Feb 13 2024.  Pls check

## 2024-02-05 MED ORDER — ESTRADIOL 0.025 MG/24HR TD PTTW
1.0000 | MEDICATED_PATCH | TRANSDERMAL | 2 refills | Status: DC
Start: 2024-02-05 — End: 2024-04-08

## 2024-02-12 NOTE — Progress Notes (Signed)
 [facility] - [address]       Telehealth:  The Patient has given verbal consent for delivery of health care via telehealth.   The patient is located at Other: work in [place]   The encounter provider is located at their Medical Office in [place]   [employer] Client was utilized for [employer].   The time spent in medical discussion during this visit was 10 minutes.    Subjective     Chief Complaint   Patient presents with    Weight Management     F/up       Date: 02/13/2024 3:16 PM   Patient ID: Maria Rowe is a 53 y.o. female.    Chief Complaint:  Chief Complaint   Patient presents with    Weight Management     F/up     Verbal consent has been obtained from the patient to conduct a video and telephone: yes      HPI:  Pt presents for follow up on weight management.    She was last seen on 4/20 after starting on Wegovy  on 12/21/23.    Pt definitely reports decreased appetite  Not much exercise due to problems with left foot- seeing podiatrist soon for follow up.    Diet- more protein, less carbs    Weight this morning was about 209 lb  Wt Readings from Last 3 Encounters:   01/16/24 98.4 kg (217 lb)   11/16/23 101.2 kg (223 lb)   10/22/23 97.5 kg (215 lb)       Problem List:  Problem List[1]    Current Medications:  Medications Taking[2]    Allergies:  Allergies[3]    Past Medical History:  Medical History[4]    Past Surgical History:  Past Surgical History[5]    Family History:  Family History[6]    Social History:  Social History[7]     The following sections were reviewed this encounter by the provider: Meds    Problems         Vitals:  There were no vitals taken for this visit.      ROS:  General ROS: negative for - chills, fatigue, fever  Psychological ROS: negative for - anxiety, concentration difficulties, depression, hallucinations or memory difficulties  Respiratory ROS: negative for - cough, orthopnea, shortness of breath, sputum changes or wheezing  Cardiovascular  ROS: negative for - chest pain, edema, irregular heartbeat, palpitations or shortness of breath  Gastrointestinal ROS: negative for - abdominal pain, appetite loss, blood in stools, constipation, diarrhea, gas/bloating, heartburn or nausea/vomiting  Musculoskeletal ROS: negative for - joint pain, joint swelling, muscle pain or muscular weakness  Neurological ROS: negative for - dizziness, headaches or numbness/tingling  Dermatological ROS: negative for dry skin, pruritus and rash         Objective:       Physical Exam:  General appearance - alert, well appearing, and in no distress  Mental status - alert, oriented to person, place, and time, normal mood, behavior, speech  Eyes - pupils equal and reactive, extraocular eye movements intact  Mouth - mucous membranes moist, pharynx normal without lesions  Chest - no increased work of breathing or audible wheezing  Skin - normal coloration and turgor, no rashes, no suspicious skin lesions noted         Assessment/Plan:       1. Encounter for weight management  - Follow Up In Primary Care  - semaglutide  (Wegovy ) 1 MG/0.5ML injection; Inject  0.5 mLs (1 mg) into the skin once a week for 4 doses  Dispense: 2 mL; Refill: 0  - Follow Up In Primary Care; Future    2. Class 2 obesity due to excess calories without serious comorbidity with body mass index (BMI) of 37.0 to 37.9 in adult  - semaglutide  (Wegovy ) 1 MG/0.5ML injection; Inject 0.5 mLs (1 mg) into the skin once a week for 4 doses  Dispense: 2 mL; Refill: 0  - Follow Up In Primary Care; Future          Maria LULLA Glatter, DO           [1]   Patient Active Problem List  Diagnosis    Neck pain    DDD (degenerative disc disease), cervical    Menopause   [2]   Outpatient Medications Marked as Taking for the 02/13/24 encounter (Telemedicine Visit) with Rowe Maria LULLA, DO   Medication Sig Dispense Refill    estradiol  (VIVELLE -DOT) 0.025 MG/24HR Place 1 patch onto the skin twice a week 8 patch 2    Multiple Vitamins-Minerals  (ONE A DAY WOMEN 50 PLUS PO) Take by mouth once      Progesterone  100 MG Cap Take 1 capsule (100 mg) by mouth daily 90 capsule 3    Wegovy  0.5 MG/0.5ML injection INJECT 0.5 ML (0.5MG ) SUBCUTANEOUSLY ONCE A WEEK     [3] No Known Allergies  [4]   Past Medical History:  Diagnosis Date    Cyst (solitary) of breast, left    [5]   Past Surgical History:  Procedure Laterality Date    CESAREAN SECTION      x3     TUBAL LIGATION      2010   [6]   Family History  Problem Relation Name Age of Onset    Heart disease Mother          CHF    Heart disease Father      Cancer Maternal Aunt          breast    Cancer Paternal Aunt          Breast    Cancer Maternal Grandmother      Cancer Paternal Grandmother     [7]   Social History  Socioeconomic History    Marital status: Married   Tobacco Use    Smoking status: Never     Passive exposure: Past    Smokeless tobacco: Never   Vaping Use    Vaping status: Never Used   Substance and Sexual Activity    Alcohol use: Yes     Comment: occ    Drug use: No    Sexual activity: Yes     Social Drivers of Psychologist, prison and probation services Strain: Low Risk  (05/21/2023)    Overall Financial Resource Strain (CARDIA)     Difficulty of Paying Living Expenses: Not hard at all   Food Insecurity: No Food Insecurity (05/21/2023)    Hunger Vital Sign     Worried About Running Out of Food in the Last Year: Never true     Ran Out of Food in the Last Year: Never true   Transportation Needs: No Transportation Needs (05/21/2023)    PRAPARE - Therapist, art (Medical): No     Lack of Transportation (Non-Medical): No   Physical Activity: Insufficiently Active (05/21/2023)    Exercise Vital Sign     Days of Exercise per Week:  3 days     Minutes of Exercise per Session: 40 min   Stress: No Stress Concern Present (05/21/2023)    [employer] of Occupational Health - Occupational Stress Questionnaire     Feeling of Stress : Only a little   Social Connections: Moderately Integrated (05/21/2023)     Social Connection and Isolation Panel [NHANES]     Frequency of Communication with Friends and Family: Once a week     Frequency of Social Gatherings with Friends and Family: Once a week     Attends Religious Services: More than 4 times per year     Active Member of [employer] or Organizations: Yes     Attends Banker Meetings: More than 4 times per year     Marital Status: Married   Catering manager Violence: Not At Risk (05/21/2023)    Humiliation, Afraid, Rape, and Kick questionnaire     Fear of Current or Ex-Partner: No     Emotionally Abused: No     Physically Abused: No     Sexually Abused: No   Housing Stability: Low Risk  (05/21/2023)    Housing Stability Vital Sign     Unable to Pay for Housing in the Last Year: No     Number of Times Moved in the Last Year: 0     Homeless in the Last Year: No

## 2024-02-13 ENCOUNTER — Encounter (INDEPENDENT_AMBULATORY_CARE_PROVIDER_SITE_OTHER): Payer: Self-pay | Admitting: Family Medicine

## 2024-02-13 ENCOUNTER — Telehealth (INDEPENDENT_AMBULATORY_CARE_PROVIDER_SITE_OTHER): Admitting: Family Medicine

## 2024-02-13 DIAGNOSIS — E66812 Obesity, class 2: Secondary | ICD-10-CM

## 2024-02-13 DIAGNOSIS — Z7689 Persons encountering health services in other specified circumstances: Secondary | ICD-10-CM

## 2024-02-13 DIAGNOSIS — Z6837 Body mass index (BMI) 37.0-37.9, adult: Secondary | ICD-10-CM

## 2024-02-13 DIAGNOSIS — E6609 Other obesity due to excess calories: Secondary | ICD-10-CM

## 2024-02-13 MED ORDER — SEMAGLUTIDE-WEIGHT MANAGEMENT 1 MG/0.5ML SC SOAJ
1.0000 mg | SUBCUTANEOUS | 0 refills | Status: DC
Start: 2024-02-13 — End: 2024-03-07

## 2024-02-13 NOTE — Progress Notes (Signed)
 Have you seen any specialists since your last visit with [place]?  No      The patient was informed that the following HM items are still outstanding:   nothing at this time, HM is up-to-date.

## 2024-02-15 ENCOUNTER — Telehealth (INDEPENDENT_AMBULATORY_CARE_PROVIDER_SITE_OTHER): Payer: Self-pay | Admitting: Family Medicine

## 2024-02-15 NOTE — Telephone Encounter (Signed)
 Left VM to call back

## 2024-02-15 NOTE — Telephone Encounter (Signed)
 Notified as pt had BW fr Physical in 06/2023 and scheduled as OV for weight management.No BW needed.

## 2024-02-15 NOTE — Telephone Encounter (Signed)
 Pt not due for physical until October  What's her reason for visit/what did she want checked?

## 2024-02-15 NOTE — Telephone Encounter (Signed)
 PT scheduled next months appt, but need Lab for blood work prior to visit. Please send order and notify PT when Order has been submitted for scheduling. Thank you

## 2024-02-27 ENCOUNTER — Encounter (INDEPENDENT_AMBULATORY_CARE_PROVIDER_SITE_OTHER): Payer: Self-pay | Admitting: Family Medicine

## 2024-03-06 NOTE — Progress Notes (Signed)
 [facility] - [address]       Telehealth:  The Patient has given verbal consent for delivery of health care via telehealth.   The patient is located at Other: work in    The encounter provider is located at their Medical Office in [employer] Client was utilized for [employer].   The time spent in medical discussion during this visit was 10 minutes.      Date: 03/07/2024 9:36 AM   Patient ID: Maria Rowe is a 53 y.o. female.    Chief Complaint:  Chief Complaint   Patient presents with    Weight Management     F/up     Verbal consent has been obtained from the patient to conduct a video and telephone: yes      HPI:  Pt presents for follow up on weight management.    She was last seen on 5/28.  She started on Wegovy  on 12/21/23.    Pt definitely reports decreased appetite and cravings    Not much exercise due to problems with left foot- in PT  Diet- more protein, less carbs    WEIGHTS  Today: 206 lb  02/13/24: 209 lb  01/16/24: 217 lb  11/16/23: 223 lb    Problem List:  Problem List[1]    Current Medications:  Medications Taking[2]    Allergies:  Allergies[3]    Past Medical History:  Medical History[4]    Past Surgical History:  Past Surgical History[5]    Family History:  Family History[6]    Social History:  Social History[7]     The following sections were reviewed this encounter by the provider: Meds    Problems         Vitals:  There were no vitals taken for this visit.      ROS:  General ROS: negative for - chills, fatigue, fever  Psychological ROS: negative for - anxiety, concentration difficulties, depression  Respiratory ROS: negative for - cough, orthopnea, shortness of breath, sputum changes or wheezing  Cardiovascular ROS: negative for - chest pain, edema, irregular heartbeat, palpitations or shortness of breath  Gastrointestinal ROS: negative for - abdominal pain, appetite loss, blood in stools, constipation, diarrhea, gas/bloating, heartburn or  nausea/vomiting  Musculoskeletal ROS: negative for - joint pain, joint swelling, muscle pain or muscular weakness  Neurological ROS: negative for - dizziness, headaches or numbness/tingling  Dermatological ROS: negative for - dry skin, pruritus and rash      Objective:       Physical Exam:  General appearance - alert, well appearing, and in no distress  Mental status - alert, oriented to person, place, and time, normal mood, behavior, speech  Eyes - pupils equal and reactive, extraocular eye movements intact  Mouth - mucous membranes moist, pharynx normal without lesions  Chest - no increased work of breathing or audible wheezing  Skin - normal coloration and turgor, no rashes, no suspicious skin lesions noted      Assessment/Plan:       1. Encounter for weight management  - Follow Up In Primary Care  - semaglutide  (Wegovy ) 1.7 MG/0.75ML injection; Inject 0.75 mLs (1.7 mg) into the skin once a week for 4 doses  Dispense: 3 mL; Refill: 0  - Follow Up In Primary Care; Future    2. Class 2 obesity due to excess calories without serious comorbidity with body mass index (BMI) of 37.0 to 37.9 in adult  - Follow Up In  Primary Care  - semaglutide  (Wegovy ) 1.7 MG/0.75ML injection; Inject 0.75 mLs (1.7 mg) into the skin once a week for 4 doses  Dispense: 3 mL; Refill: 0  - Follow Up In Primary Care; Future        Marshall LULLA Glatter, DO           [1]   Patient Active Problem List  Diagnosis    Neck pain    DDD (degenerative disc disease), cervical    Menopause   [2]   Outpatient Medications Marked as Taking for the 03/07/24 encounter (Telemedicine Visit) with Glatter Marshall LULLA, DO   Medication Sig Dispense Refill    estradiol  (VIVELLE -DOT) 0.025 MG/24HR Place 1 patch onto the skin twice a week 8 patch 2    Multiple Vitamins-Minerals (ONE A DAY WOMEN 50 PLUS PO) Take by mouth once      Progesterone  100 MG Cap Take 1 capsule (100 mg) by mouth daily 90 capsule 3    [DISCONTINUED] semaglutide  (Wegovy ) 1 MG/0.5ML injection Inject  0.5 mLs (1 mg) into the skin once a week for 4 doses 2 mL 0   [3] No Known Allergies  [4]   Past Medical History:  Diagnosis Date    Cyst (solitary) of breast, left    [5]   Past Surgical History:  Procedure Laterality Date    CESAREAN SECTION      x3     TUBAL LIGATION      2010   [6]   Family History  Problem Relation Name Age of Onset    Heart disease Mother          CHF    Heart disease Father      Cancer Maternal Aunt          breast    Cancer Paternal Aunt          Breast    Cancer Maternal Grandmother      Cancer Paternal Grandmother     [7]   Social History  Socioeconomic History    Marital status: Married   Tobacco Use    Smoking status: Never     Passive exposure: Past    Smokeless tobacco: Never   Vaping Use    Vaping status: Never Used   Substance and Sexual Activity    Alcohol use: Yes     Comment: occ    Drug use: No    Sexual activity: Yes     Social Drivers of Psychologist, prison and probation services Strain: Low Risk  (05/21/2023)    Overall Financial Resource Strain (CARDIA)     Difficulty of Paying Living Expenses: Not hard at all   Food Insecurity: No Food Insecurity (05/21/2023)    Hunger Vital Sign     Worried About Running Out of Food in the Last Year: Never true     Ran Out of Food in the Last Year: Never true   Transportation Needs: No Transportation Needs (05/21/2023)    PRAPARE - Therapist, art (Medical): No     Lack of Transportation (Non-Medical): No   Physical Activity: Insufficiently Active (05/21/2023)    Exercise Vital Sign     Days of Exercise per Week: 3 days     Minutes of Exercise per Session: 40 min   Stress: No Stress Concern Present (05/21/2023)    [employer] of Occupational Health - Occupational Stress Questionnaire     Feeling of Stress : Only a  little   Social Connections: Moderately Integrated (05/21/2023)    Social Connection and Isolation Panel     Frequency of Communication with Friends and Family: Once a week     Frequency of Social Gatherings with Friends  and Family: Once a week     Attends Religious Services: More than 4 times per year     Active Member of [employer] or Organizations: Yes     Attends Banker Meetings: More than 4 times per year     Marital Status: Married   Catering manager Violence: Not At Risk (05/21/2023)    Humiliation, Afraid, Rape, and Kick questionnaire     Fear of Current or Ex-Partner: No     Emotionally Abused: No     Physically Abused: No     Sexually Abused: No   Housing Stability: Low Risk  (05/21/2023)    Housing Stability Vital Sign     Unable to Pay for Housing in the Last Year: No     Number of Times Moved in the Last Year: 0     Homeless in the Last Year: No

## 2024-03-07 ENCOUNTER — Encounter (INDEPENDENT_AMBULATORY_CARE_PROVIDER_SITE_OTHER): Payer: Self-pay | Admitting: Family Medicine

## 2024-03-07 ENCOUNTER — Telehealth (INDEPENDENT_AMBULATORY_CARE_PROVIDER_SITE_OTHER): Admitting: Family Medicine

## 2024-03-07 DIAGNOSIS — E6609 Other obesity due to excess calories: Secondary | ICD-10-CM

## 2024-03-07 DIAGNOSIS — Z7689 Persons encountering health services in other specified circumstances: Secondary | ICD-10-CM

## 2024-03-07 DIAGNOSIS — E66812 Obesity, class 2: Secondary | ICD-10-CM

## 2024-03-07 DIAGNOSIS — Z780 Asymptomatic menopausal state: Secondary | ICD-10-CM

## 2024-03-07 DIAGNOSIS — Z6837 Body mass index (BMI) 37.0-37.9, adult: Secondary | ICD-10-CM

## 2024-03-07 MED ORDER — SEMAGLUTIDE-WEIGHT MANAGEMENT 1.7 MG/0.75ML SC SOAJ
1.7000 mg | SUBCUTANEOUS | 0 refills | Status: AC
Start: 2024-03-07 — End: 2024-03-29

## 2024-03-07 NOTE — Progress Notes (Signed)
Have you seen any specialists since your last visit with us?  Yes    PT    The patient was informed that the following HM items are still outstanding:   nothing at this time, HM is up-to-date.

## 2024-03-31 IMAGING — MR RM COTOVELO ESQ
6 series · 40 of 40 positions shown · non-contrast
Comparison: none

[Series 1: 3 planos · axial · 5.0mm · 1.37mm/px · z∈[-7,+175]mm · 3 of 9 slices shown]
[im 1/9]
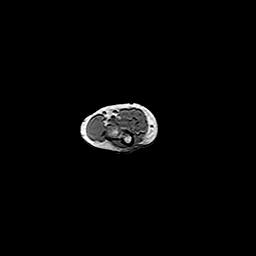
[im 5/9]
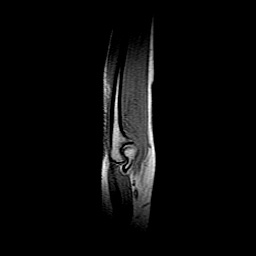
[im 9/9]
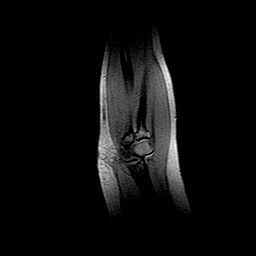

[Series 3: STIR · coronal · 3.5mm · 0.90mm/px · 7 of 16 slices shown (1 of 3)]
[im 1/16]
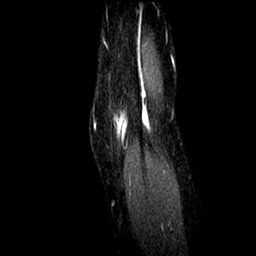
[im 3/16]
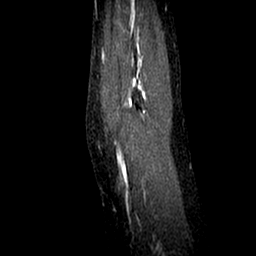
[im 6/16]
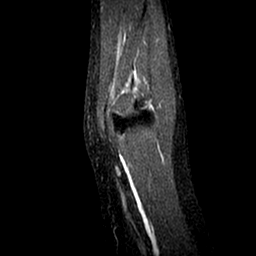
[im 8/16]
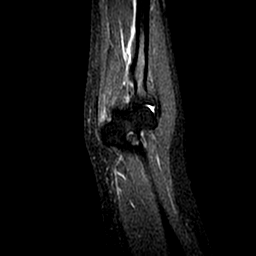
[im 11/16]
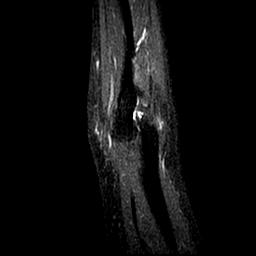
[im 13/16]
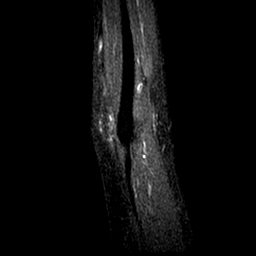
[im 16/16]
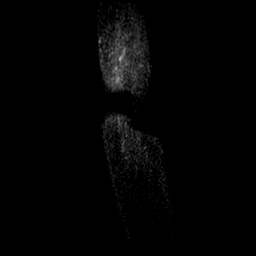

[Series 4: T1 · coronal · 3.5mm · 0.90mm/px · 7 of 16 slices shown (1 of 2)]
[im 1/16]
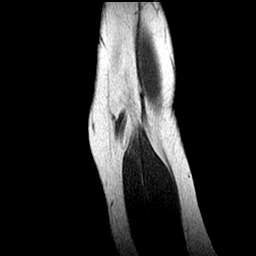
[im 3/16]
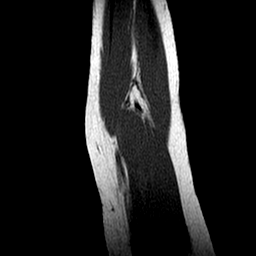
[im 6/16]
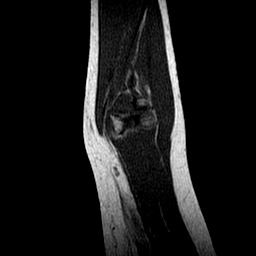
[im 8/16]
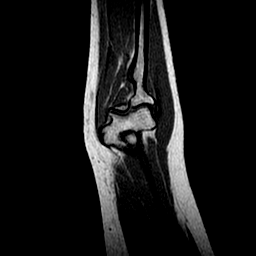
[im 11/16]
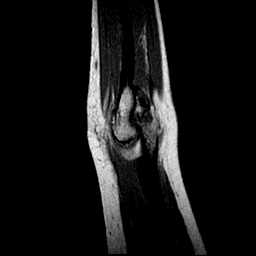
[im 13/16]
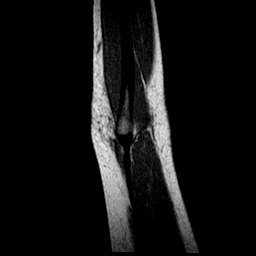
[im 16/16]
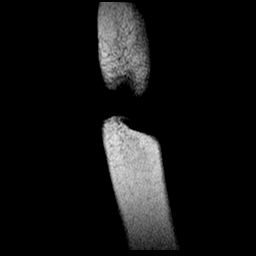

[Series 6: STIR · sagittal · 4.0mm · 0.90mm/px · 7 of 16 slices shown (2 of 3)]
[im 1/16]
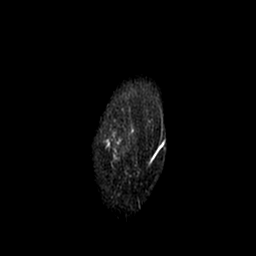
[im 3/16]
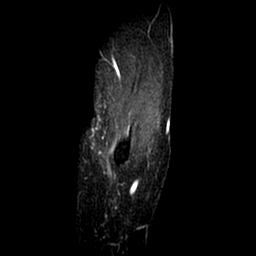
[im 6/16]
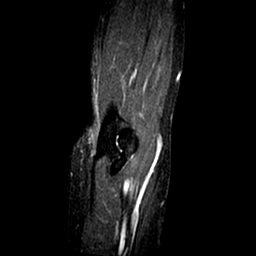
[im 8/16]
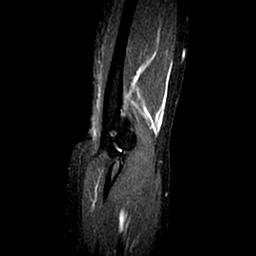
[im 11/16]
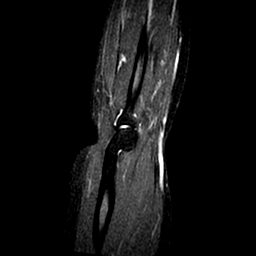
[im 13/16]
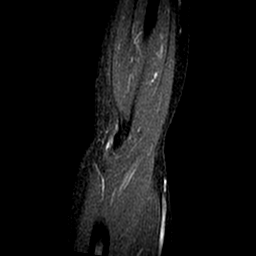
[im 16/16]
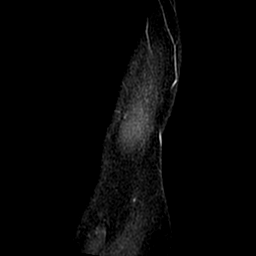

[Series 7: STIR · axial · 4.0mm · 0.86mm/px · z∈[-39,+36]mm · 8 of 18 slices shown (3 of 3)]
[im 1/18]
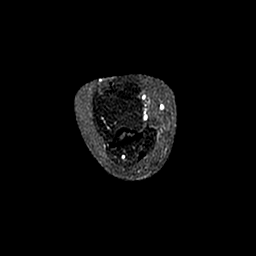
[im 3/18]
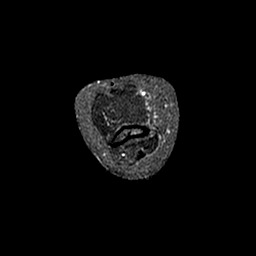
[im 5/18]
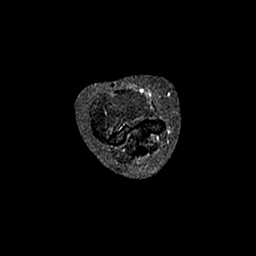
[im 8/18]
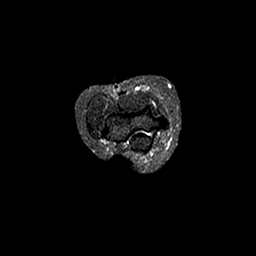
[im 10/18]
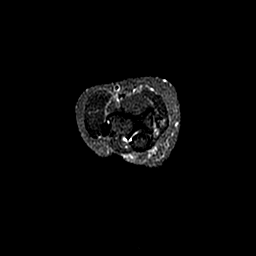
[im 13/18]
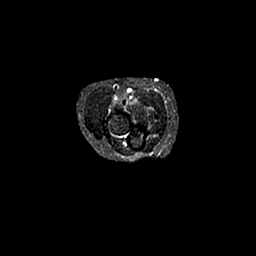
[im 15/18]
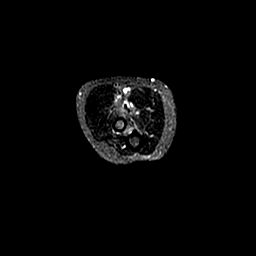
[im 18/18]
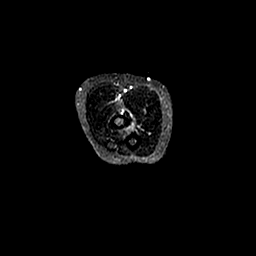

[Series 8: T1 · axial · 4.0mm · 0.86mm/px · z∈[-48,+27]mm · 8 of 18 slices shown (2 of 2)]
[im 1/18]
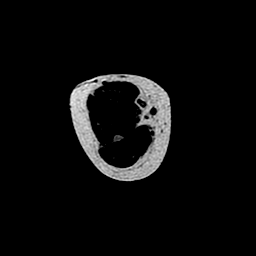
[im 3/18]
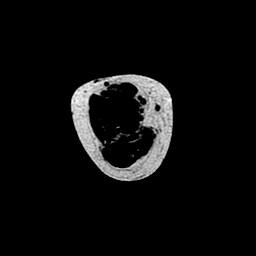
[im 5/18]
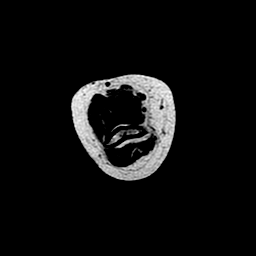
[im 8/18]
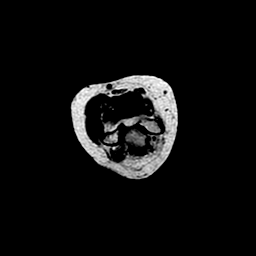
[im 10/18]
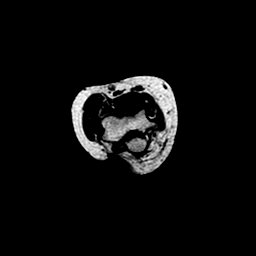
[im 13/18]
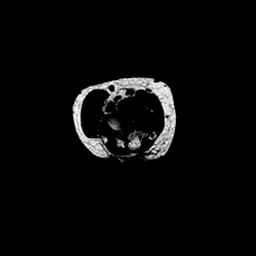
[im 15/18]
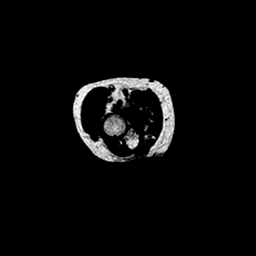
[im 18/18]
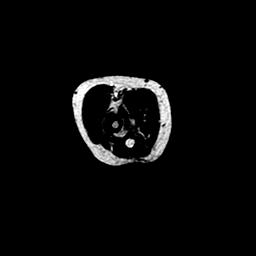

[40 of 40 positions shown; findings below may reference images not displayed]

RESSONÂNCIA MAGNÉTICA DO COTOVELO ESQUERDO

TÉCNICA:
Exame realizado em equipamento de ressonância magnética com sequências, ponderações e planos específicos para o segmento de interesse, sem a administração endovenosa de contraste paramagnético.

RESULTADO:
Estruturas ósseas analisadas de morfologia e sinal normais.
Articulações do cotovelo de aspecto preservado.
Ausência de derrame articular significativo.
Tendões comuns dos flexores e extensores de espessura e sinal normal.
Tendões do bíceps e tríceps braquial de aspecto habitual.
Ligamentos colaterais medial e lateral com espessura, orientação e sinal habitual.
Túnel cubital livre.
Ausência de massas sólidas ou císticas.
Grupamentos musculares íntegros e com trofismo adequado à faixa etária.

CONCLUSÃO:
Ressonância magnética do cotovelo esquerdo compatível com a normalidade.

## 2024-03-31 IMAGING — MR RM COTOVELO D
7 series · 40 of 40 positions shown · non-contrast
Comparison: none

[Series 1: 3 planos · axial · 5.0mm · 1.37mm/px · z∈[-7,+175]mm · 2 of 9 slices shown]
[im 1/9]
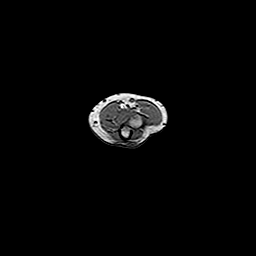
[im 9/9]
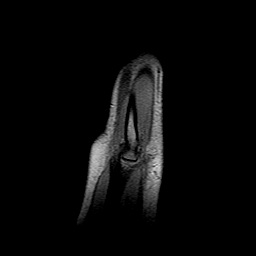

[Series 2: STIR · coronal · 3.0mm · 0.90mm/px · 6 of 16 slices shown (1 of 4)]
[im 1/16]
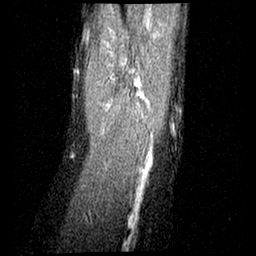
[im 4/16]
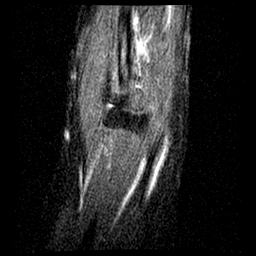
[im 7/16]
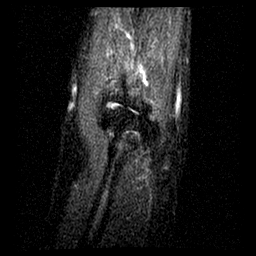
[im 10/16]
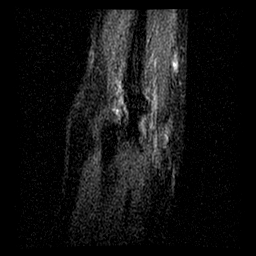
[im 13/16]
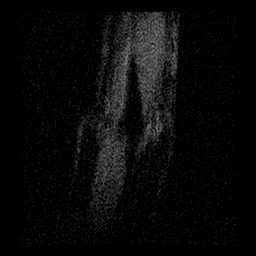
[im 16/16]
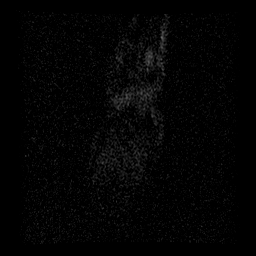

[Series 3: STIR · sagittal · 4.0mm · 0.90mm/px · 6 of 16 slices shown (2 of 4)]
[im 1/16]
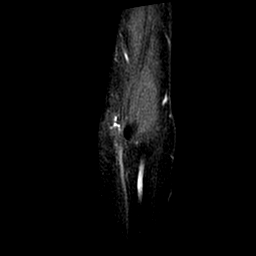
[im 4/16]
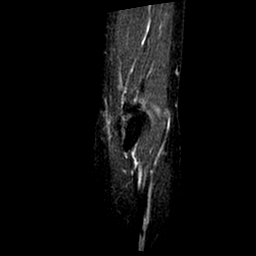
[im 7/16]
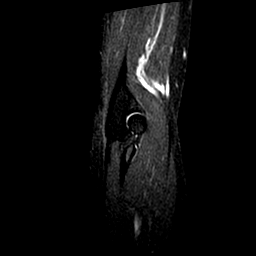
[im 10/16]
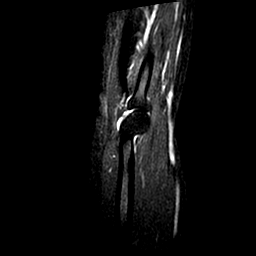
[im 13/16]
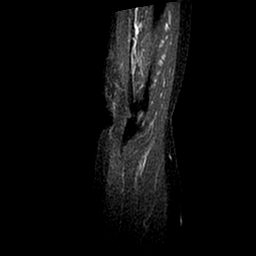
[im 16/16]
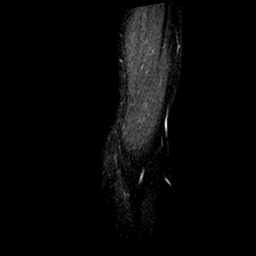

[Series 4: T1 · coronal · 3.0mm · 0.90mm/px · 6 of 16 slices shown (1 of 2)]
[im 1/16]
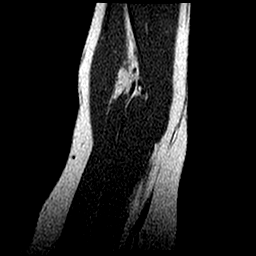
[im 4/16]
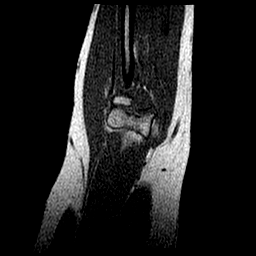
[im 7/16]
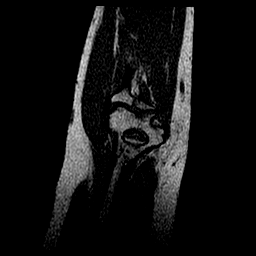
[im 10/16]
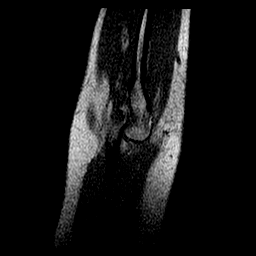
[im 13/16]
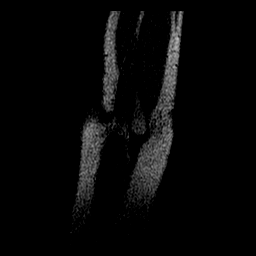
[im 16/16]
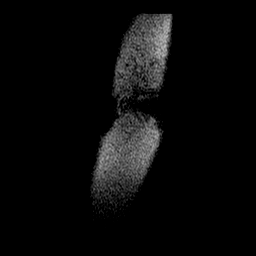

[Series 5: STIR · axial · 4.0mm · 0.86mm/px · z∈[-106,-26]mm · 7 of 20 slices shown (3 of 4)]
[im 1/20]
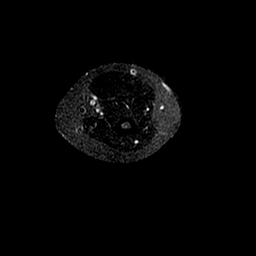
[im 4/20]
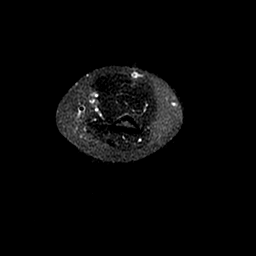
[im 7/20]
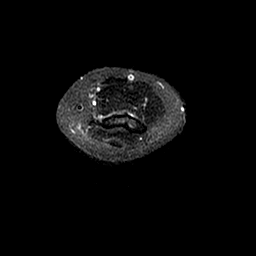
[im 10/20]
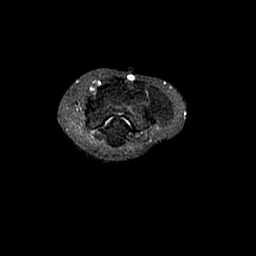
[im 13/20]
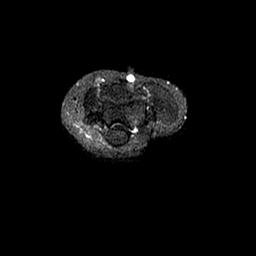
[im 16/20]
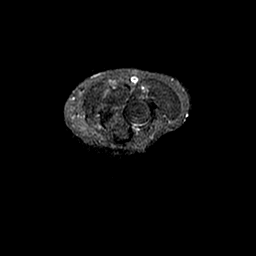
[im 20/20]
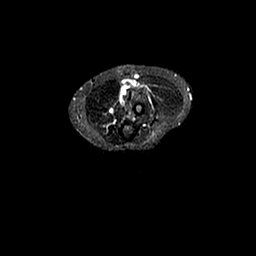

[Series 6: T1 · axial · 4.0mm · 0.86mm/px · z∈[-106,-26]mm · 7 of 20 slices shown (2 of 2)]
[im 1/20]
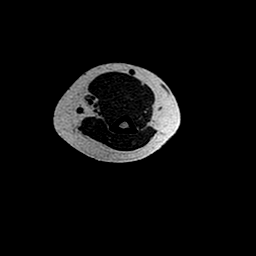
[im 4/20]
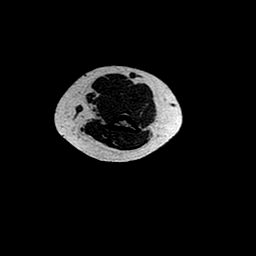
[im 7/20]
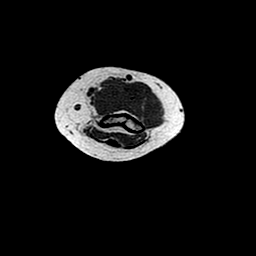
[im 10/20]
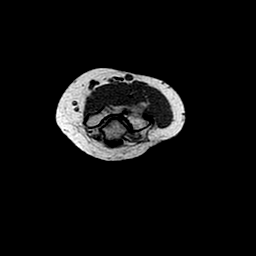
[im 13/20]
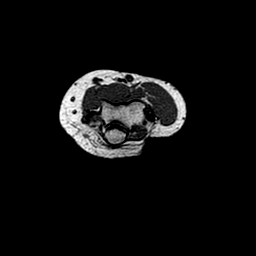
[im 16/20]
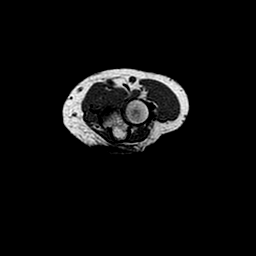
[im 20/20]
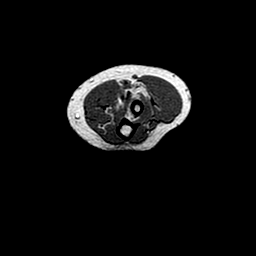

[Series 7: STIR · coronal · 3.0mm · 0.90mm/px · 6 of 16 slices shown (4 of 4)]
[im 1/16]
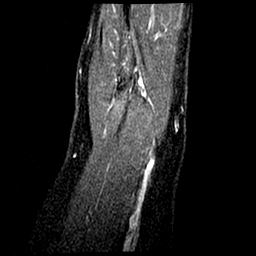
[im 4/16]
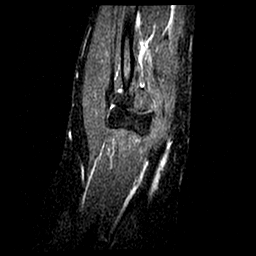
[im 7/16]
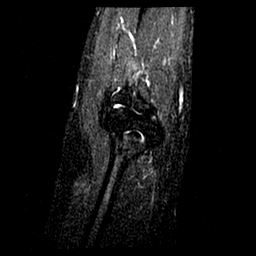
[im 10/16]
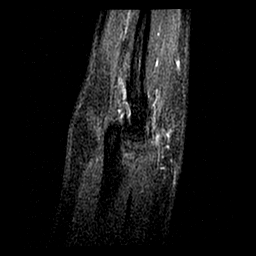
[im 13/16]
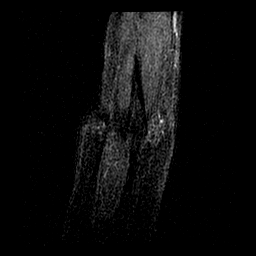
[im 16/16]
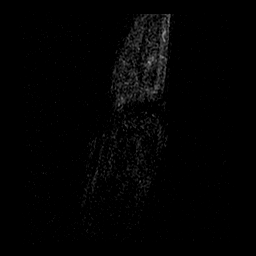

[40 of 40 positions shown; findings below may reference images not displayed]

RESSONÂNCIA MAGNÉTICA DO COTOVELO DIREITO

TÉCNICA:
Exame realizado em equipamento de ressonância magnética com sequências, ponderações e planos específicos para o segmento de interesse, sem a administração endovenosa de contraste paramagnético.

RESULTADO:
Estruturas ósseas analisadas de morfologia e sinal normais.
Articulações do cotovelo de aspecto preservado.
Ausência de derrame articular significativo.
Tendões comuns dos flexores e extensores de espessura e sinal normal.
Tendões do bíceps e tríceps braquial de aspecto habitual.
Ligamentos colaterais medial e lateral com espessura, orientação e sinal habitual.
Túnel cubital livre.
Ausência de massas sólidas ou císticas.
Grupamentos musculares íntegros e com trofismo adequado à faixa etária.

CONCLUSÃO:
Ressonância magnética do cotovelo direito compatível com a normalidade.

## 2024-04-04 NOTE — Progress Notes (Unsigned)
 [facility] [address]     {Vanishing Tip Click a link below to be taken to that activity or part of the chart   Chart Review  Order Review  Review Flowsheets  Labs  Health Maintenance  Immunizations  Allergies  Medications  Problem List  History  Synopsis :55325}      Date: 04/08/2024 1:12 PM   Patient ID: Maria Rowe is a 53 y.o. female.    Chief Complaint:  No chief complaint on file.    Verbal consent has been obtained from the patient to conduct a video and telephone: yes      HPI:  Pt presents for follow up on weight management.    She was last seen on 6/20.  She started on Wegovy  on 12/21/23.    Pt definitely reports decreased appetite and cravings    Not much exercise due to problems with left foot- in PT  Diet- more protein, less carbs    WEIGHTS  Today:   03/07/24: 206 lb  02/13/24: 209 lb  01/16/24: 217 lb  11/16/23: 223 lb    Problem List:  Problem List[1]    Current Medications:  Medications Taking[2]    Allergies:  Allergies[3]    Past Medical History:  Medical History[4]    Past Surgical History:  Past Surgical History[5]    Family History:  Family History[6]    Social History:  Social History[7]     The following sections were reviewed this encounter by the provider:        Vitals:  There were no vitals taken for this visit.      ROS:  General ROS: negative for - chills, fatigue, fever  Psychological ROS: negative for - anxiety, concentration difficulties, depression  Respiratory ROS: negative for - cough, orthopnea, shortness of breath, sputum changes or wheezing  Cardiovascular ROS: negative for - chest pain, edema, irregular heartbeat, palpitations or shortness of breath  Gastrointestinal ROS: negative for - abdominal pain, appetite loss, blood in stools, constipation, diarrhea, gas/bloating, heartburn or nausea/vomiting  Musculoskeletal ROS: negative for - joint pain, joint swelling, muscle pain or muscular weakness  Neurological ROS: negative for - dizziness,  headaches or numbness/tingling  Dermatological ROS: negative for - dry skin, pruritus and rash      Objective:       Physical Exam:  General appearance - alert, well appearing, and in no distress  Mental status - alert, oriented to person, place, and time, normal mood, behavior, speech  Eyes - pupils equal and reactive, extraocular eye movements intact  Mouth - mucous membranes moist, pharynx normal without lesions  Chest - no increased work of breathing or audible wheezing  Skin - normal coloration and turgor, no rashes, no suspicious skin lesions noted      Assessment/Plan:       There are no diagnoses linked to this encounter.        Marshall LULLA Glatter, DO           [1]   Patient Active Problem List  Diagnosis    Neck pain    DDD (degenerative disc disease), cervical    Menopause   [2]   No outpatient medications have been marked as taking for the 04/08/24 encounter (Appointment) with Glatter Marshall LULLA, DO.   [3] No Known Allergies  [4]   Past Medical History:  Diagnosis Date    Cyst (solitary) of breast, left    [5]   Past Surgical History:  Procedure Laterality Date    CESAREAN SECTION      x3     TUBAL LIGATION      2010   [6]   Family History  Problem Relation Name Age of Onset    Heart disease Mother          CHF    Heart disease Father      Cancer Maternal Aunt          breast    Cancer Paternal Aunt          Breast    Cancer Maternal Grandmother      Cancer Paternal Grandmother     [7]   Social History  Socioeconomic History    Marital status: Married   Tobacco Use    Smoking status: Never     Passive exposure: Past    Smokeless tobacco: Never   Vaping Use    Vaping status: Never Used   Substance and Sexual Activity    Alcohol use: Yes     Comment: occ    Drug use: No    Sexual activity: Yes     Social Drivers of Psychologist, prison and probation services Strain: Low Risk  (05/21/2023)    Overall Financial Resource Strain (CARDIA)     Difficulty of Paying Living Expenses: Not hard at all   Food Insecurity: No Food  Insecurity (05/21/2023)    Hunger Vital Sign     Worried About Running Out of Food in the Last Year: Never true     Ran Out of Food in the Last Year: Never true   Transportation Needs: No Transportation Needs (05/21/2023)    PRAPARE - Therapist, art (Medical): No     Lack of Transportation (Non-Medical): No   Physical Activity: Insufficiently Active (05/21/2023)    Exercise Vital Sign     Days of Exercise per Week: 3 days     Minutes of Exercise per Session: 40 min   Stress: No Stress Concern Present (05/21/2023)    [employer] of Occupational Health - Occupational Stress Questionnaire     Feeling of Stress : Only a little   Social Connections: Moderately Integrated (05/21/2023)    Social Connection and Isolation Panel     Frequency of Communication with Friends and Family: Once a week     Frequency of Social Gatherings with Friends and Family: Once a week     Attends Religious Services: More than 4 times per year     Active Member of [employer] or Organizations: Yes     Attends Banker Meetings: More than 4 times per year     Marital Status: Married   Catering manager Violence: Not At Risk (05/21/2023)    Humiliation, Afraid, Rape, and Kick questionnaire     Fear of Current or Ex-Partner: No     Emotionally Abused: No     Physically Abused: No     Sexually Abused: No   Housing Stability: Low Risk  (05/21/2023)    Housing Stability Vital Sign     Unable to Pay for Housing in the Last Year: No     Number of Times Moved in the Last Year: 0     Homeless in the Last Year: No

## 2024-04-08 ENCOUNTER — Encounter (INDEPENDENT_AMBULATORY_CARE_PROVIDER_SITE_OTHER): Payer: Self-pay | Admitting: Family Medicine

## 2024-04-08 ENCOUNTER — Ambulatory Visit (INDEPENDENT_AMBULATORY_CARE_PROVIDER_SITE_OTHER): Admitting: Family Medicine

## 2024-04-08 VITALS — BP 120/73 | HR 83 | Temp 98.0°F | Resp 18 | Wt 206.0 lb

## 2024-04-08 DIAGNOSIS — Z6837 Body mass index (BMI) 37.0-37.9, adult: Secondary | ICD-10-CM

## 2024-04-08 DIAGNOSIS — Z7689 Persons encountering health services in other specified circumstances: Secondary | ICD-10-CM

## 2024-04-08 DIAGNOSIS — E6609 Other obesity due to excess calories: Secondary | ICD-10-CM

## 2024-04-08 DIAGNOSIS — Z780 Asymptomatic menopausal state: Secondary | ICD-10-CM

## 2024-04-08 DIAGNOSIS — E66812 Obesity, class 2: Secondary | ICD-10-CM

## 2024-04-08 MED ORDER — ESTRADIOL 0.1 MG/GM VA CREA
0.4000 g | TOPICAL_CREAM | VAGINAL | 3 refills | Status: AC
Start: 2024-04-08 — End: ?

## 2024-04-08 MED ORDER — ESTRADIOL 0.025 MG/24HR TD PTTW
1.0000 | MEDICATED_PATCH | TRANSDERMAL | 5 refills | Status: AC
Start: 2024-04-08 — End: ?

## 2024-04-08 MED ORDER — SEMAGLUTIDE-WEIGHT MANAGEMENT 2.4 MG/0.75ML SC SOAJ
2.4000 mg | SUBCUTANEOUS | 2 refills | Status: DC
Start: 2024-04-08 — End: 2024-06-26

## 2024-04-08 NOTE — Progress Notes (Signed)
Have you seen any specialists since your last visit with [place]?  Yes PT,Podiatrist      The patient was informed that the following HM items are still outstanding:   nothing at this time, HM is up-to-date.

## 2024-04-24 ENCOUNTER — Encounter (INDEPENDENT_AMBULATORY_CARE_PROVIDER_SITE_OTHER): Payer: Self-pay | Admitting: Family Medicine

## 2024-04-24 ENCOUNTER — Ambulatory Visit (INDEPENDENT_AMBULATORY_CARE_PROVIDER_SITE_OTHER): Admitting: Family Medicine

## 2024-04-24 VITALS — BP 108/69 | HR 109 | Temp 98.1°F | Resp 19 | Wt 208.0 lb

## 2024-04-24 DIAGNOSIS — R238 Other skin changes: Secondary | ICD-10-CM

## 2024-04-24 LAB — LAB USE ONLY - CBC WITH DIFFERENTIAL
Absolute Basophils: 0.03 x10 3/uL (ref 0.00–0.08)
Absolute Eosinophils: 0.07 x10 3/uL (ref 0.00–0.44)
Absolute Immature Granulocytes: 0.02 x10 3/uL (ref 0.00–0.07)
Absolute Lymphocytes: 2.25 x10 3/uL (ref 0.42–3.22)
Absolute Monocytes: 0.61 x10 3/uL (ref 0.21–0.85)
Absolute Neutrophils: 5.3 x10 3/uL (ref 1.10–6.33)
Absolute nRBC: 0 x10 3/uL (ref <=0.00)
Basophils %: 0.4 %
Eosinophils %: 0.8 %
Hematocrit: 43.2 % (ref 34.7–43.7)
Hemoglobin: 14.5 g/dL (ref 11.4–14.8)
Immature Granulocytes %: 0.2 %
Lymphocytes %: 27.2 %
MCH: 31 pg (ref 25.1–33.5)
MCHC: 33.6 g/dL (ref 31.5–35.8)
MCV: 92.5 fL (ref 78.0–96.0)
MPV: 9 fL (ref 8.9–12.5)
Monocytes %: 7.4 %
Neutrophils %: 64 %
Platelet Count: 339 x10 3/uL (ref 142–346)
Preliminary Absolute Neutrophil Count: 5.3 x10 3/uL (ref 1.10–6.33)
RBC: 4.67 x10 6/uL (ref 3.90–5.10)
RDW: 13 % (ref 11–15)
WBC: 8.28 x10 3/uL (ref 3.10–9.50)
nRBC %: 0 /100{WBCs} (ref <=0.0)

## 2024-04-24 LAB — COMPREHENSIVE METABOLIC PANEL
ALT: 24 U/L (ref <=55)
AST (SGOT): 19 U/L (ref <=41)
Albumin/Globulin Ratio: 1.2 (ref 0.9–2.2)
Albumin: 4.2 g/dL (ref 3.5–5.0)
Alkaline Phosphatase: 67 U/L (ref 37–117)
Anion Gap: 11 (ref 5.0–15.0)
BUN: 20 mg/dL (ref 7–21)
Bilirubin, Total: 0.6 mg/dL (ref 0.2–1.2)
CO2: 23 meq/L (ref 17–29)
Calcium: 9.3 mg/dL (ref 8.5–10.5)
Chloride: 106 meq/L (ref 99–111)
Creatinine: 0.8 mg/dL (ref 0.4–1.0)
GFR: >60 mL/min/1.73 m2 (ref >=60.0)
Globulin: 3.4 g/dL (ref 2.0–3.6)
Glucose: 103 mg/dL — ABNORMAL HIGH (ref 70–100)
Hemolysis Index: 9 {index}
Potassium: 4.1 meq/L (ref 3.5–5.3)
Protein, Total: 7.6 g/dL (ref 6.0–8.3)
Sodium: 140 meq/L (ref 135–145)

## 2024-04-24 NOTE — Progress Notes (Signed)
 Have you seen any specialists since your last visit with us ? Yes Pt      The patient was informed that the following HM items are still outstanding:   influenza vaccine

## 2024-04-24 NOTE — Progress Notes (Signed)
 Subjective:      Date: 04/24/2024 3:57 PM   Patient ID: Maria Rowe is a 53 y.o. female.    Chief Complaint:  Chief Complaint   Patient presents with    Skin Irritation     Skin Irritation,sensitive to touch,very discomfort on her upper back,gluteal spot, both arms and  X 4 days.  Concern due to bug bite in thigh (Rt) X aweek or vaginal cream or weight loss med       HPI: Patient presents with complaints of skin irritation and sensitivity.  She states that this started about 4 to 5 days ago.  She denies any itching or visible rash.  No known new exposures such as detergents or lotions.  She has not been in any bodies of water or had any recent travel.  She states that overall her health has been improving as she has been exercising regularly - Walking 2-4x/week for several miles.  She is concerned that this symptom could be due to her GLP medication or her hormone therapy, both of which she is doing well on.    Problem List:  Problem List[1]    Current Medications:  Medications Taking[2]    Allergies:  Allergies[3]    Past Medical History:  Medical History[4]    Past Surgical History:  Past Surgical History[5]    Family History:  Family History[6]    Social History:  Social History[7]     The following sections were reviewed this encounter by the provider: Meds    Problems         Vitals:  BP 108/69 (BP Site: Left arm, Patient Position: Sitting, Cuff Size: Large)   Pulse (!) 109   Temp 98.1 F (36.7 C) (Temporal)   Resp 19   Wt 94.3 kg (208 lb)   SpO2 95%   BMI 35.70 kg/m       ROS:  General ROS: negative for - chills, fatigue, fever  Respiratory ROS: negative for - cough, shortness of breath  Cardiovascular ROS: negative for - chest pain, palpitations  Gastrointestinal ROS: negative for - abdominal pain, nausea/vomiting  Dermatological ROS: negative for dry skin, pruritus and rash         Objective:       Physical Exam:  General appearance - alert, well appearing, and in no distress  Mental status -  alert, oriented to person, place, and time, normal mood, behavior  Chest - clear to auscultation, no wheezes, rales or rhonchi, symmetric air entry  Heart - normal rate, regular rhythm, normal S1, S2, no murmurs, rubs, clicks or gallops  Skin - normal coloration and turgor, no rashes, no suspicious skin lesions noted       Assessment/Plan:       1. Skin sensitivity  - CBC with Differential (Order); Future  - Comprehensive Metabolic Panel; Future    -  Call office with any changes in symptoms      Marshall LULLA Glatter, DO           [1]   Patient Active Problem List  Diagnosis    Neck pain    DDD (degenerative disc disease), cervical    Menopause   [2]   Outpatient Medications Marked as Taking for the 04/24/24 encounter (Office Visit) with Glatter Marshall LULLA, DO   Medication Sig Dispense Refill    estradiol  (ESTRACE ) 0.1 MG/GM vaginal cream Place 0.4 g vaginally twice a week 42.5 g 3    estradiol  (VIVELLE -DOT) 0.025 MG/24HR Place  1 patch onto the skin twice a week 8 patch 5    Multiple Vitamins-Minerals (ONE A DAY WOMEN 50 PLUS PO) Take by mouth once      Progesterone  100 MG Cap Take 1 capsule (100 mg) by mouth daily 90 capsule 3    semaglutide  (Wegovy ) 2.4 MG/0.75ML injection Inject 0.75 mLs (2.4 mg) into the skin once a week for 12 doses 3 mL 2   [3] No Known Allergies  [4]   Past Medical History:  Diagnosis Date    Cyst (solitary) of breast, left    [5]   Past Surgical History:  Procedure Laterality Date    CESAREAN SECTION      x3     TUBAL LIGATION      2010   [6]   Family History  Problem Relation Name Age of Onset    Heart disease Mother          CHF    Heart disease Father      Cancer Maternal Aunt          breast    Cancer Paternal Aunt          Breast    Cancer Maternal Grandmother      Cancer Paternal Grandmother     [7]   Social History  Socioeconomic History    Marital status: Married   Tobacco Use    Smoking status: Never     Passive exposure: Past    Smokeless tobacco: Never   Vaping Use    Vaping status:  Never Used   Substance and Sexual Activity    Alcohol use: Yes     Alcohol/week: 0.0 - 1.0 standard drinks of alcohol     Comment: occ    Drug use: No    Sexual activity: Yes     Social Drivers of Psychologist, prison and probation services Strain: Low Risk  (04/24/2024)    Overall Financial Resource Strain (CARDIA)     Difficulty of Paying Living Expenses: Not hard at all   Food Insecurity: No Food Insecurity (04/24/2024)    Hunger Vital Sign     Worried About Running Out of Food in the Last Year: Never true     Ran Out of Food in the Last Year: Never true   Transportation Needs: No Transportation Needs (04/24/2024)    PRAPARE - Therapist, art (Medical): No     Lack of Transportation (Non-Medical): No   Physical Activity: Sufficiently Active (04/24/2024)    Exercise Vital Sign     Days of Exercise per Week: 4 days     Minutes of Exercise per Session: 50 min   Stress: No Stress Concern Present (04/24/2024)    [employer] of Occupational Health - Occupational Stress Questionnaire     Feeling of Stress : Not at all   Social Connections: Moderately Integrated (05/21/2023)    Social Connection and Isolation Panel     Frequency of Communication with Friends and Family: Once a week     Frequency of Social Gatherings with Friends and Family: Once a week     Attends Religious Services: More than 4 times per year     Active Member of [employer] or Organizations: Yes     Attends Banker Meetings: More than 4 times per year     Marital Status: Married   Catering manager Violence: Not At Risk (04/24/2024)    Humiliation, Afraid, Rape, and Kick  questionnaire     Fear of Current or Ex-Partner: No     Emotionally Abused: No     Physically Abused: No     Sexually Abused: No   Housing Stability: Not At Risk (04/24/2024)    Housing Stability NCSS     Do you have housing?: Yes     Are you worried about losing your housing?: No

## 2024-04-25 ENCOUNTER — Ambulatory Visit (INDEPENDENT_AMBULATORY_CARE_PROVIDER_SITE_OTHER): Payer: Self-pay | Admitting: Family Medicine

## 2024-04-29 ENCOUNTER — Other Ambulatory Visit (INDEPENDENT_AMBULATORY_CARE_PROVIDER_SITE_OTHER): Payer: Self-pay | Admitting: Family Medicine

## 2024-04-29 DIAGNOSIS — Z780 Asymptomatic menopausal state: Secondary | ICD-10-CM

## 2024-06-03 ENCOUNTER — Other Ambulatory Visit (INDEPENDENT_AMBULATORY_CARE_PROVIDER_SITE_OTHER): Payer: Self-pay | Admitting: Family Medicine

## 2024-06-03 DIAGNOSIS — E66812 Obesity, class 2: Secondary | ICD-10-CM

## 2024-06-03 DIAGNOSIS — Z7689 Persons encountering health services in other specified circumstances: Secondary | ICD-10-CM

## 2024-06-04 NOTE — Telephone Encounter (Signed)
 Left VM as med has refills.

## 2024-06-05 NOTE — Telephone Encounter (Signed)
 Confirmed with pharmacy that pt will pick last refill.

## 2024-06-19 ENCOUNTER — Other Ambulatory Visit (INDEPENDENT_AMBULATORY_CARE_PROVIDER_SITE_OTHER): Payer: Self-pay | Admitting: Family Medicine

## 2024-06-26 ENCOUNTER — Ambulatory Visit (INDEPENDENT_AMBULATORY_CARE_PROVIDER_SITE_OTHER): Admitting: Family Medicine

## 2024-06-26 ENCOUNTER — Encounter (INDEPENDENT_AMBULATORY_CARE_PROVIDER_SITE_OTHER): Payer: Self-pay | Admitting: Family Medicine

## 2024-06-26 VITALS — BP 115/73 | HR 76 | Temp 98.2°F | Resp 18 | Ht 64.5 in | Wt 199.0 lb

## 2024-06-26 DIAGNOSIS — E66812 Obesity, class 2: Secondary | ICD-10-CM

## 2024-06-26 DIAGNOSIS — Z6835 Body mass index (BMI) 35.0-35.9, adult: Secondary | ICD-10-CM

## 2024-06-26 DIAGNOSIS — Z780 Asymptomatic menopausal state: Secondary | ICD-10-CM

## 2024-06-26 DIAGNOSIS — Z0000 Encounter for general adult medical examination without abnormal findings: Secondary | ICD-10-CM

## 2024-06-26 DIAGNOSIS — Z23 Encounter for immunization: Secondary | ICD-10-CM

## 2024-06-26 DIAGNOSIS — L237 Allergic contact dermatitis due to plants, except food: Secondary | ICD-10-CM

## 2024-06-26 DIAGNOSIS — E6609 Other obesity due to excess calories: Secondary | ICD-10-CM

## 2024-06-26 LAB — LIPID PANEL
Cholesterol / HDL Ratio: 4.2 {index}
Cholesterol: 192 mg/dL (ref <=199)
HDL: 46 mg/dL (ref >=40)
LDL Calculated: 127 mg/dL — ABNORMAL HIGH (ref 0–99)
Triglycerides: 106 mg/dL (ref 34–149)
VLDL Calculated: 19 mg/dL (ref 10–40)

## 2024-06-26 LAB — COMPREHENSIVE METABOLIC PANEL
ALT: 27 U/L (ref <=55)
AST (SGOT): 24 U/L (ref <=41)
Albumin/Globulin Ratio: 2 (ref 0.9–2.2)
Albumin: 4.8 g/dL (ref 3.5–4.9)
Alkaline Phosphatase: 57 U/L (ref 37–117)
Anion Gap: 6 (ref 5.0–15.0)
BUN: 17 mg/dL (ref 7–21)
Bilirubin, Total: 0.8 mg/dL (ref 0.2–1.2)
CO2: 23 meq/L (ref 17–29)
Calcium: 8.6 mg/dL (ref 8.5–10.5)
Chloride: 109 meq/L (ref 99–111)
Creatinine: 0.8 mg/dL (ref 0.4–1.0)
GFR: >60 mL/min/1.73 m2 (ref >=60.0)
Globulin: 2.4 g/dL (ref 2.0–3.6)
Glucose: 77 mg/dL (ref 70–100)
Hemolysis Index: 8 {index}
Potassium: 4.2 meq/L (ref 3.5–5.3)
Protein, Total: 7.2 g/dL (ref 6.0–8.3)
Sodium: 138 meq/L (ref 135–145)

## 2024-06-26 LAB — LAB USE ONLY - CBC WITH DIFFERENTIAL
Absolute Basophils: 0.03 x10 3/uL (ref 0.00–0.08)
Absolute Eosinophils: 0.14 x10 3/uL (ref 0.00–0.44)
Absolute Immature Granulocytes: 0 x10 3/uL (ref 0.00–0.07)
Absolute Lymphocytes: 1.53 x10 3/uL (ref 0.42–3.22)
Absolute Monocytes: 0.29 x10 3/uL (ref 0.21–0.85)
Absolute Neutrophils: 2.58 x10 3/uL (ref 1.10–6.33)
Absolute nRBC: 0 x10 3/uL (ref <=0.00)
Basophils %: 0.7 %
Eosinophils %: 3.1 %
Hematocrit: 42.4 % (ref 34.7–43.7)
Hemoglobin: 14.7 g/dL (ref 11.4–14.8)
Immature Granulocytes %: 0 %
Lymphocytes %: 33.5 %
MCH: 31.6 pg (ref 25.1–33.5)
MCHC: 34.7 g/dL (ref 31.5–35.8)
MCV: 91.2 fL (ref 78.0–96.0)
MPV: 8.7 fL — ABNORMAL LOW (ref 8.9–12.5)
Monocytes %: 6.3 %
Neutrophils %: 56.4 %
Platelet Count: 335 x10 3/uL (ref 142–346)
Preliminary Absolute Neutrophil Count: 2.58 x10 3/uL (ref 1.10–6.33)
RBC: 4.65 x10 6/uL (ref 3.90–5.10)
RDW: 13 % (ref 11–15)
WBC: 4.57 x10 3/uL (ref 3.10–9.50)
nRBC %: 0 /100{WBCs} (ref <=0.0)

## 2024-06-26 LAB — TSH: TSH: 0.66 u[IU]/mL (ref 0.35–4.94)

## 2024-06-26 LAB — HEMOGLOBIN A1C
Average Estimated Glucose: 99.7 mg/dL
Hemoglobin A1C: 5.1 % (ref 4.6–5.6)

## 2024-06-26 MED ORDER — METHYLPREDNISOLONE 4 MG PO TBPK: ORAL_TABLET | ORAL | 0 refills | Status: DC

## 2024-06-26 MED ORDER — SEMAGLUTIDE-WEIGHT MANAGEMENT 2.4 MG/0.75ML SC SOAJ
2.4000 mg | SUBCUTANEOUS | 2 refills | Status: AC
Start: 2024-06-26 — End: 2024-09-12

## 2024-06-26 NOTE — Progress Notes (Signed)
 Date: 06/26/2024 10:25 AM   Patient ID: Maria Rowe is a 53 y.o. female.         Have you seen any specialists since your last visit with us ? Yes - Podiatrist  The patient was informed that the following HM items are still outstanding: influenza vaccine    Subjective:      Chief Complaint:  Chief Complaint   Patient presents with    Annual Exam     Pt fasting    Blister     Blisters, redness and  itchy rash on face, both lower arm and chest X 6 days.  Pt worked in [place] and developed with rash.       HPI:  Visit Type: Health Maintenance Visit  Work Status: working full-time  Reported Health: good health  Reported Diet: compliant with well-balanced diet  Reported Exercise: regularly, 30-60 minutes/day, and walking  Dental: regular dental visits twice a year  Vision: glasses and eye exam < 1 year ago  Hearing: normal hearing  Immunization Status: Influenza vaccination due  Reproductive Health: sexually active  Prior Screening Tests: last Cologuard 2023, pap smear within past 1-3 years, and mammogram 1 year ago  General Health Risks: no family history of prostate cancer, no family history of colon cancer, and family history of breast cancer  Safety Elements Used: uses seat belts, smoke detectors in household, carbon monoxide detectors in household, sunscreen use, and does not text and drive    Pt states that about 6 days ago she was working outside in her yards.  She subsequently developed a blistering rash with accompanying redness and itching on the face, arms, and chest.      Problem List:  Problem List[1]    Current Medications:  Medications Taking[2]     Allergies:  Allergies[3]    Past Medical History:  Medical History[4]    Past Surgical History:  Past Surgical History[5]    Family History:  Family History[6]    Social History:  Social History[7]     The following sections were reviewed this encounter by the provider: Meds           Vitals:  BP 115/73 (BP Site: Left arm, Patient Position: Sitting, Cuff Size:  Medium)   Pulse 76   Temp 98.2 F (36.8 C) (Temporal)   Resp 18   Ht 1.638 m (5' 4.5)   Wt 90.3 kg (199 lb)   SpO2 96%   BMI 33.63 kg/m   Wt Readings from Last 3 Encounters:   06/26/24 90.3 kg (199 lb)   04/24/24 94.3 kg (208 lb)   04/08/24 93.4 kg (206 lb)         ROS:  General ROS: negative for - chills, fatigue, fever, hot flashes, malaise, night sweats, sleep disturbance, weight gain or weight loss  Psychological ROS: negative for - anxiety, concentration difficulties, depression, hallucinations or memory difficulties  Ophthalmic ROS: negative for - blurry vision, double vision, eye pain or photophobia  ENT ROS: negative for - hearing change, nasal congestion, nasal discharge, sinus pain, sore throat, tinnitus or vertigo  Allergy and Immunology ROS: negative for - hives, itchy/watery eyes, nasal congestion, postnasal drip or seasonal allergies  Hematological and Lymphatic ROS: negative for - bleeding problems, bruising, fatigue, night sweats, swollen lymph nodes or weight loss  Endocrine ROS: negative for - hot flashes, polydipsia/polyuria, temperature intolerance or unexpected weight changes  Breast ROS: negative for - new or changing breast lumps or nipple discharge  Respiratory  ROS: negative for - cough, orthopnea, shortness of breath, sputum changes or wheezing  Cardiovascular ROS: negative for - chest pain, edema, irregular heartbeat, palpitations or shortness of breath  Gastrointestinal ROS: negative for - abdominal pain, appetite loss, blood in stools, constipation, diarrhea, gas/bloating, heartburn or nausea/vomiting  Genito-Urinary ROS: negative for - dysuria, hematuria or urinary frequency/urgency  Musculoskeletal ROS: negative for - joint pain, joint swelling, muscle pain or muscular weakness  Neurological ROS: negative for - dizziness, headaches or numbness/tingling  Dermatological ROS: negative for - dry skin; scattered blisters over arms and face         Objective:       Physical  Exam:  General appearance - alert, well appearing, and in no distress, oriented to person, place, and time and normal appearing weight  Mental status - alert, oriented to person, place, and time, normal mood, appropriate behavior, speech, dress, motor activity, and thought processes  Eyes - pupils equal and reactive, extraocular eye movements intact  Ears - bilateral TM's and external ear canals normal  Nose - normal and patent, no erythema, discharge or polyps and normal nontender sinuses  Mouth - mucous membranes moist, pharynx normal without lesions  Neck - supple, no significant adenopathy, no cervical lymphadenopathy  Chest - clear to auscultation, no wheezes, rales or rhonchi, symmetric air entry  Heart - normal rate, regular rhythm, normal S1, S2, no murmurs, rubs, clicks or gallops  Abdomen - soft, nontender, nondistended, no masses or organomegaly  Neurological - alert, oriented, normal speech, no focal findings or movement disorder noted  Musculoskeletal - no joint tenderness, deformity or swelling  Extremities - peripheral pulses normal, no pedal edema, no clubbing or cyanosis  Skin - normal coloration and turgor; scattered blisters over arms and face       Assessment/Plan:       1. Well adult exam  - CBC with Differential (Order); Future  - Comprehensive Metabolic Panel; Future  - TSH; Future  - Lipid Panel; Future  - Hemoglobin A1C; Future  - Follow Up In Primary Care; Future    2. Menopause  - Dxa Bone Density Axial Skeleton; Future    3. Class 2 obesity due to excess calories without serious comorbidity with body mass index (BMI) of 35.0 to 35.9 in adult  - semaglutide  (Wegovy ) 2.4 MG/0.75ML injection; Inject 0.75 mLs (2.4 mg) into the skin once a week for 12 doses  Dispense: 3 mL; Refill: 2    4. Need for vaccination  - influenza trivalent 2025-2026 inactivated vaccine 6 months and older (AFLURIA/FLUZONE/FLUARIX /FLULAVAL ) single-dose 0.5 mL    5. Poison ivy dermatitis  - methylPREDNISolone (MEDROL  DOSEPAK) 4 MG tablet; follow package directions  Dispense: 21 tablet; Refill: 0        Marshall LULLA Glatter, DO           [1]   Patient Active Problem List  Diagnosis    Neck pain    DDD (degenerative disc disease), cervical    Menopause   [2]   Outpatient Medications Marked as Taking for the 06/26/24 encounter (Office Visit) with Glatter Marshall LULLA, DO   Medication Sig Dispense Refill    estradiol  (ESTRACE ) 0.1 MG/GM vaginal cream Place 0.4 g vaginally twice a week 42.5 g 3    estradiol  (VIVELLE -DOT) 0.025 MG/24HR Place 1 patch onto the skin twice a week 8 patch 5    Multiple Vitamins-Minerals (ONE A DAY WOMEN 50 PLUS PO) Take by mouth once  Progesterone  100 MG Cap Take 1 capsule (100 mg) by mouth daily 90 capsule 3    [DISCONTINUED] semaglutide  (Wegovy ) 2.4 MG/0.75ML injection Inject 0.75 mLs (2.4 mg) into the skin once a week for 12 doses 3 mL 2   [3] No Known Allergies  [4]   Past Medical History:  Diagnosis Date    Cyst (solitary) of breast, left    [5]   Past Surgical History:  Procedure Laterality Date    CESAREAN SECTION      x3     TUBAL LIGATION      2010   [6]   Family History  Problem Relation Name Age of Onset    Heart disease Mother          CHF    Heart disease Father      Cancer Maternal Aunt          breast    Cancer Paternal Aunt          Breast    Cancer Maternal Grandmother      Cancer Paternal Grandmother     [7]   Social History  Tobacco Use    Smoking status: Never     Passive exposure: Past    Smokeless tobacco: Never   Vaping Use    Vaping status: Never Used   Substance Use Topics    Alcohol use: Yes     Alcohol/week: 0.0 - 1.0 standard drinks of alcohol     Comment: occ    Drug use: No

## 2024-06-27 ENCOUNTER — Ambulatory Visit (INDEPENDENT_AMBULATORY_CARE_PROVIDER_SITE_OTHER): Payer: Self-pay | Admitting: Family Medicine

## 2024-07-09 ENCOUNTER — Telehealth (INDEPENDENT_AMBULATORY_CARE_PROVIDER_SITE_OTHER): Payer: Self-pay | Admitting: Family Medicine

## 2024-07-09 NOTE — Telephone Encounter (Signed)
 Maria Rowe (Key: [id number])  Wegovy  0.25MG /0.5ML auto-injectors  Form  [employer] Electronic PA Form (2017 NCPDP)  Created  21 hours ago  Sent to Plan  14 minutes ago  Plan Response  14 minutes ago  Submit Clinical Questions  10 minutes ago  Determination  Favorable  4 minutes ago  Your prior authorization for [employer]  has been approved!  More Info  Personalized support and financial assistance may be available through the [employer] program. For more information, and to see program requirements, click on the More Info button to the right.  Message from plan: Your PA request has been approved. Additional information will be provided in the approval communication. (Message 1145). Authorization Expiration Date: July 09, 2025.

## 2024-07-11 ENCOUNTER — Encounter (INDEPENDENT_AMBULATORY_CARE_PROVIDER_SITE_OTHER): Payer: Self-pay | Admitting: Family Medicine

## 2024-08-21 ENCOUNTER — Encounter (INDEPENDENT_AMBULATORY_CARE_PROVIDER_SITE_OTHER): Payer: Self-pay | Admitting: Family Medicine

## 2024-09-16 ENCOUNTER — Encounter (INDEPENDENT_AMBULATORY_CARE_PROVIDER_SITE_OTHER): Payer: Self-pay

## 2024-09-16 ENCOUNTER — Ambulatory Visit

## 2024-09-16 VITALS — BP 111/74 | HR 88 | Temp 98.1°F | Wt 181.3 lb

## 2024-09-16 DIAGNOSIS — J40 Bronchitis, not specified as acute or chronic: Secondary | ICD-10-CM

## 2024-09-16 LAB — IHS AMB POCT SOFIA (TM) COVID-19 & FLU A/B
Sofia Influenza A Ag POCT: NEGATIVE
Sofia Influenza B Ag POCT: NEGATIVE
Sofia SARS COV2 Antigen POCT: NEGATIVE

## 2024-09-16 MED ORDER — METHYLPREDNISOLONE 4 MG PO TBPK: ORAL_TABLET | ORAL | 0 refills | Status: AC

## 2024-09-16 MED ORDER — PSEUDOEPH-BROMPHEN-DM 30-2-10 MG/5ML PO SYRP: 5.0000 mL | ORAL_SOLUTION | Freq: Four times a day (QID) | ORAL | 0 refills | Status: AC | PRN

## 2024-09-16 NOTE — Progress Notes (Signed)
 [facility]    PROGRESS NOTE      Patient: Maria Rowe   Date: 09/16/2024   MRN: [id number]     Medical History[1]  Social History[2]   Family History[3]    ASSESSMENT/PLAN     Maria Rowe is a 53 y.o. female    Chief Complaint   Patient presents with    Sinus Problem     SYMPTOMS: sinus pressure, nasal congestion, cough, body aches  ONSET: 6 days  DENIES: ear pain, fever  TREATMENT TRIED: cold and flu, vapor rub       DIAGNOSIS   1. Bronchitis  - SARS-CoV-2 (COVID-19) RNA, PCR; Future  - Sofia(TM) SARS COVID19 & Flu A/B POCT; Future  - brompheniramine-pseudoephedrine-DM 30-2-10 MG/5ML syrup; Take 5 mLs by mouth 4 (four) times daily as needed (Cough and congestion)  Dispense: 1118 mL; Refill: 0  - methylPREDNISolone  (MEDROL  DOSEPAK) 4 MG tablet; follow package directions  Dispense: 21 tablet; Refill: 0  - SARS-CoV-2 (COVID-19) RNA, PCR  - Sofia(TM) SARS COVID19 & Flu A/B POCT    Assessment & Plan  Acute bronchitis  Symptoms for six days include productive cough, congestion, body aches, and fatigue. Negative COVID test at home.. Lungs clear, indicating bronchitis. Sinus congestion with fluid in ears. No UTI or systemic infection. Previous Medrol  Dosepak use without side effects.    Rapid Covid-19 is negative, PCR sent out to lab   Rapid Influenza is negative for Influenza A/B  Review results with patient during appointment. Will call patient if any results from the lab are positive.     - Prescribed Bromfed for symptomatic relief, up to four times daily as needed.  - Prescribed Medrol  Dosepak for six days to reduce inflammation. Side effects reviewed.  - Advised to send MyChart message if symptoms persist after six days.  - Consider chest x-ray and antibiotics if symptoms persist.  - Increase fluid intake to 1-2 L per day to help loosen secretions. Cough and deep breath.   - Take OTC cough suppressant as needed, Tylenol  or Ibuprofen  as needed. -  Encourage daily vitamin C intake of  500-1000mg , hand hygiene, and adequate rest. - Signs and symptoms reviewed with patient that warrants RTO. Patient agreeable to plan.        RESULTS   No results found for this or any previous visit (from the past 24 hours).  Risk & Benefits of the new medication(s) were explained to the patient (and family) who verbalized understanding & agreed to the treatment plan. Patient (family) encouraged to contact me/clinical staff with any questions/concerns      MEDICATIONS     Medications Taking[4]      Allergies[5]    SUBJECTIVE     Chief Complaint   Patient presents with    Sinus Problem     SYMPTOMS: sinus pressure, nasal congestion, cough, body aches  ONSET: 6 days  DENIES: ear pain, fever  TREATMENT TRIED: cold and flu, vapor rub        HPI  History of Present Illness  Maria Rowe is a 53 year old female who presents with six days of upper respiratory symptoms.    For six days she has had sinus pressure, nasal congestion, body aches, fatigue, and a productive cough. She describes chest congestion and tightness, feeling like everything's stuck in her chest.    Her symptoms started after both daughters were sick at home and have since recovered. An at-home COVID test was  negative. She has used over-the-counter cold and flu medications with partial relief but still feels congested.    She has recurrent bronchitis but no asthma or other lung disease.    She denies fever, nausea, vomiting, diarrhea, chest pain, dyspnea, consistent sore throat, dizziness, urinary symptoms, or typical headache, though she notes a back headache.       ROS     Review of Systems   Constitutional:  Positive for fatigue. Negative for chills and fever.   HENT:  Positive for congestion, postnasal drip, rhinorrhea, sinus pressure and sinus pain. Negative for ear pain and sore throat.    Eyes:  Negative for redness.   Respiratory:  Positive for cough. Negative for chest tightness, shortness of breath and wheezing.    Cardiovascular:   Negative for chest pain and palpitations.   Gastrointestinal:  Negative for abdominal pain, constipation, diarrhea, nausea and vomiting.   Endocrine: Negative.    Genitourinary: Negative.  Negative for dysuria, flank pain and frequency.   Musculoskeletal:  Positive for myalgias. Negative for back pain.   Skin: Negative.    Allergic/Immunologic: Negative.    Neurological:  Positive for headaches. Negative for dizziness.   Hematological: Negative.    Psychiatric/Behavioral: Negative.          The following sections were reviewed this encounter by the provider:        PHYSICAL EXAM     Vitals:    09/16/24 0834   BP: 111/74   BP Site: Left arm   Patient Position: Sitting   Cuff Size: Large   Pulse: 88   Temp: 98.1 F (36.7 C)   TempSrc: Temporal   SpO2: 98%   Weight: 82.2 kg (181 lb 4.8 oz)       Physical Exam  Vitals and nursing note reviewed.   Constitutional:       General: She is not in acute distress.     Appearance: Normal appearance. She is not ill-appearing, toxic-appearing or diaphoretic.   HENT:      Head: Normocephalic and atraumatic.      Right Ear: Hearing, ear canal and external ear normal. A middle ear effusion is present. There is no impacted cerumen.      Left Ear: Hearing, ear canal and external ear normal. A middle ear effusion is present. There is no impacted cerumen.      Nose: Congestion and rhinorrhea present. Rhinorrhea is clear.      Right Sinus: Maxillary sinus tenderness and frontal sinus tenderness present.      Left Sinus: Maxillary sinus tenderness and frontal sinus tenderness present.      Mouth/Throat:      Pharynx: Pharyngeal swelling, posterior oropharyngeal erythema and postnasal drip present. No oropharyngeal exudate.      Tonsils: No tonsillar exudate or tonsillar abscesses.   Eyes:      Conjunctiva/sclera: Conjunctivae normal.   Cardiovascular:      Rate and Rhythm: Normal rate and regular rhythm.      Heart sounds: Normal heart sounds.   Pulmonary:      Effort: Pulmonary effort is  normal. No respiratory distress.      Breath sounds: Normal breath sounds. No wheezing.   Musculoskeletal:      Cervical back: Tenderness present.   Lymphadenopathy:      Cervical: Cervical adenopathy present.   Skin:     General: Skin is warm and dry.   Neurological:      General: No focal deficit present.  Mental Status: She is alert and oriented to person, place, and time. Mental status is at baseline.   Psychiatric:         Mood and Affect: Mood normal.         Behavior: Behavior normal. Behavior is cooperative.         Thought Content: Thought content normal.         Judgment: Judgment normal.       Ortho Exam  Neurological Exam  Mental Status  Alert.    Cranial Nerves  CN VIII:  Right: Hearing is normal.  Left: Hearing is normal.      PROCEDURE(S)     Procedures    Verbal consent obtained to record this visit.     Signed,  Hubbard Isles, FNP  09/16/2024           [1]  Past Medical History:  Diagnosis Date    Cyst (solitary) of breast, left    [2]  Social History  Tobacco Use    Smoking status: Never     Passive exposure: Past    Smokeless tobacco: Never   Vaping Use    Vaping status: Never Used   Substance Use Topics    Alcohol use: Yes     Alcohol/week: 0.0 - 1.0 standard drinks of alcohol     Comment: occ    Drug use: No   [3]  Family History  Problem Relation Name Age of Onset    Heart disease Mother          CHF    Heart disease Father      Cancer Maternal Aunt          breast    Cancer Paternal Aunt          Breast    Cancer Maternal Grandmother      Cancer Paternal Grandmother     [4]  Outpatient Medications Marked as Taking for the 09/16/24 encounter (Office Visit) with Isles Hubbard, FNP   Medication Sig Dispense Refill    estradiol  (VIVELLE -DOT) 0.025 MG/24HR Place 1 patch onto the skin twice a week 8 patch 5    Multiple Vitamins-Minerals (ONE A DAY WOMEN 50 PLUS PO) Take by mouth once      Progesterone  100 MG Cap Take 1 capsule (100 mg) by mouth daily 90 capsule 3   [5]  No Known  Allergies

## 2024-09-19 ENCOUNTER — Ambulatory Visit (INDEPENDENT_AMBULATORY_CARE_PROVIDER_SITE_OTHER): Payer: Self-pay

## 2024-09-19 LAB — SARS-COV-2 (COVID-19) RNA, PCR: SARS-CoV-2 (COVID-19) RNA: NOT DETECTED

## 2024-10-23 ENCOUNTER — Other Ambulatory Visit (INDEPENDENT_AMBULATORY_CARE_PROVIDER_SITE_OTHER): Payer: Self-pay | Admitting: Family Medicine

## 2024-10-23 DIAGNOSIS — Z780 Asymptomatic menopausal state: Secondary | ICD-10-CM
